# Patient Record
Sex: Female | Born: 1995 | Race: White | Hispanic: No | Marital: Married | Smoking: Never smoker
Health system: Southern US, Community
[De-identification: ages and names within clinical notes are randomized; demographics above are authoritative.]

## PROBLEM LIST (undated history)

## (undated) DIAGNOSIS — R112 Nausea with vomiting, unspecified: Secondary | ICD-10-CM

## (undated) DIAGNOSIS — D649 Anemia, unspecified: Secondary | ICD-10-CM

## (undated) DIAGNOSIS — F419 Anxiety disorder, unspecified: Secondary | ICD-10-CM

## (undated) HISTORY — DX: Other specified postprocedural states: R11.2

## (undated) HISTORY — DX: Anxiety disorder, unspecified: F41.9

## (undated) HISTORY — PX: TONSILLECTOMY: SUR1361

## (undated) HISTORY — DX: Nausea with vomiting, unspecified: R11.2

---
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
## 2013-10-07 ENCOUNTER — Ambulatory Visit (INDEPENDENT_AMBULATORY_CARE_PROVIDER_SITE_OTHER): Payer: BC Managed Care – PPO | Admitting: Family Medicine

## 2013-10-07 ENCOUNTER — Encounter: Payer: Self-pay | Admitting: Family Medicine

## 2013-10-07 VITALS — BP 116/72 | Temp 97.9°F | Ht 70.0 in | Wt 201.0 lb

## 2013-10-07 DIAGNOSIS — J329 Chronic sinusitis, unspecified: Secondary | ICD-10-CM

## 2013-10-07 MED ORDER — CEFPROZIL 500 MG PO TABS
500.0000 mg | ORAL_TABLET | Freq: Two times a day (BID) | ORAL | Status: DC
Start: 2013-10-07 — End: 2016-06-25

## 2013-10-07 NOTE — Progress Notes (Signed)
   Subjective:    Patient ID: Mckenzie RimaHolly E Reeves, female    DOB: January 18, 1996, 18 y.o.   MRN: [id number]  Sinusitis This is a new problem. Episode onset: Saturday am. There has been no fever. Associated symptoms include congestion, coughing, headaches, sinus pressure, sneezing and a sore throat. Past treatments include oral decongestants. The treatment provided no relief.   First stared with sore throat  Now tend in the glands  Face hurts eye hurts  Eyes and frontal headache  Blood and secretions plus green   Review of Systems  HENT: Positive for congestion, sinus pressure, sneezing and sore throat.   Respiratory: Positive for cough.   Neurological: Positive for headaches.       Objective:   Physical Exam  Alert moderate malaise. Good hydration. Vitals reviewed. HEENT moderate nasal congestion frontal tenderness. Trace normal neck supple lungs clear heart regular in rhythm      Assessment & Plan:   impression rhinosinusitis plan antibiotics prescribed. Symptomatic care discussed. Warning signs discussed. WSL

## 2016-06-25 ENCOUNTER — Encounter: Payer: Self-pay | Admitting: Family Medicine

## 2016-06-25 ENCOUNTER — Ambulatory Visit (INDEPENDENT_AMBULATORY_CARE_PROVIDER_SITE_OTHER): Payer: Managed Care, Other (non HMO) | Admitting: Family Medicine

## 2016-06-25 VITALS — BP 112/72 | Temp 98.3°F | Ht 70.0 in | Wt 208.0 lb

## 2016-06-25 DIAGNOSIS — J209 Acute bronchitis, unspecified: Secondary | ICD-10-CM

## 2016-06-25 DIAGNOSIS — J019 Acute sinusitis, unspecified: Secondary | ICD-10-CM

## 2016-06-25 DIAGNOSIS — J111 Influenza due to unidentified influenza virus with other respiratory manifestations: Secondary | ICD-10-CM | POA: Diagnosis not present

## 2016-06-25 MED ORDER — OSELTAMIVIR PHOSPHATE 75 MG PO CAPS: 75.0000 mg | ORAL_CAPSULE | Freq: Two times a day (BID) | ORAL | 0 refills | Status: DC

## 2016-06-25 MED ORDER — DOXYCYCLINE HYCLATE 100 MG PO CAPS: 100.0000 mg | ORAL_CAPSULE | Freq: Two times a day (BID) | ORAL | 0 refills | Status: DC

## 2016-06-25 NOTE — Patient Instructions (Signed)

## 2016-06-25 NOTE — Progress Notes (Signed)
   Subjective:    Patient ID: Prudence DavidsonHolly E Minervini, female    DOB: Nov 05, 1995, 21 y.o.   MRN: [id number]  URI   This is a new problem. The current episode started in the past 7 days. The problem has been unchanged. The maximum temperature recorded prior to her arrival was 102 - 102.9 F. Associated symptoms include congestion, coughing and rhinorrhea. Pertinent negatives include no chest pain, ear pain or wheezing. Associated symptoms comments: Body aches, runny nose, chills. Treatments tried: otc cold and flu gel caps. The treatment provided no relief.   Patient relates that she is felt a crackling in her throat when she coughs she denies being short of breath. She states she is had off and on body aches along with off and on low-grade fevers and feeling clammy from Friday night got worse on Saturday and Sunday relates moderate fatigue   Review of Systems  Constitutional: Negative for activity change and fever.  HENT: Positive for congestion and rhinorrhea. Negative for ear pain.   Eyes: Negative for discharge.  Respiratory: Positive for cough. Negative for shortness of breath and wheezing.   Cardiovascular: Negative for chest pain.       Objective:   Physical Exam Not rest or distress eardrums normal not toxic throat normal lungs clear heart regular no crackles no rails or rhonchi       Assessment & Plan:  Viral syndrome Probable influenza Secondary rhinosinusitis and bronchitis Tamiflu and antibiotics indicated Warning signs regarding the flu and warning signs regarding pneumonia discussed Lab work x-rays not indicated currently.

## 2016-06-28 ENCOUNTER — Ambulatory Visit (INDEPENDENT_AMBULATORY_CARE_PROVIDER_SITE_OTHER): Payer: Managed Care, Other (non HMO) | Admitting: Obstetrics and Gynecology

## 2016-06-28 ENCOUNTER — Encounter: Payer: Self-pay | Admitting: Obstetrics and Gynecology

## 2016-06-28 VITALS — BP 118/70 | HR 72 | Resp 14 | Ht 68.5 in | Wt 207.0 lb

## 2016-06-28 DIAGNOSIS — N913 Primary oligomenorrhea: Secondary | ICD-10-CM

## 2016-06-28 DIAGNOSIS — Z0000 Encounter for general adult medical examination without abnormal findings: Secondary | ICD-10-CM

## 2016-06-28 DIAGNOSIS — Z3009 Encounter for other general counseling and advice on contraception: Secondary | ICD-10-CM | POA: Diagnosis not present

## 2016-06-28 DIAGNOSIS — Z113 Encounter for screening for infections with a predominantly sexual mode of transmission: Secondary | ICD-10-CM | POA: Diagnosis not present

## 2016-06-28 DIAGNOSIS — Z01419 Encounter for gynecological examination (general) (routine) without abnormal findings: Secondary | ICD-10-CM

## 2016-06-28 DIAGNOSIS — Z124 Encounter for screening for malignant neoplasm of cervix: Secondary | ICD-10-CM

## 2016-06-28 DIAGNOSIS — N926 Irregular menstruation, unspecified: Secondary | ICD-10-CM | POA: Diagnosis not present

## 2016-06-28 DIAGNOSIS — E663 Overweight: Secondary | ICD-10-CM | POA: Diagnosis not present

## 2016-06-28 LAB — CBC
HEMATOCRIT: 41 % (ref 35.0–45.0)
Hemoglobin: 13.6 g/dL (ref 11.7–15.5)
MCH: 29.7 pg (ref 27.0–33.0)
MCHC: 33.2 g/dL (ref 32.0–36.0)
MCV: 89.5 fL (ref 80.0–100.0)
MPV: 9.9 fL (ref 7.5–12.5)
Platelets: 252 10*3/uL (ref 140–400)
RBC: 4.58 MIL/uL (ref 3.80–5.10)
RDW: 12.9 % (ref 11.0–15.0)
WBC: 4.8 10*3/uL (ref 3.8–10.8)

## 2016-06-28 LAB — POCT URINE PREGNANCY: Preg Test, Ur: NEGATIVE

## 2016-06-28 MED ORDER — MISOPROSTOL 200 MCG PO TABS: ORAL_TABLET | ORAL | 0 refills | Status: DC

## 2016-06-28 NOTE — Progress Notes (Signed)
21 y.o. G0P0000 MarriedCaucasianF here for annual exam.   She can go a year without a period or have one every month. It has been like this since she started her cycle. She has very bad back pain with her cycles. She gets nausea and dizzy with the pain in her back.  She stopped OCP's the end of November. No BTB.  Sexually active, married, with her husband x 5 years. Abstaining while off of OCP's. Cramps were bad even on the pill. She has a h/o bad acne, currently better with the medications she is on. No hirsutism. No weight changes.  Period Duration (Days): 3-13 days  Period Pattern: (!) Irregular Menstrual Flow: Heavy Menstrual Control: Maxi pad, Tampon Menstrual Control Change Freq (Hours): changes pad every 2 -3 hours on heavy days  Dysmenorrhea: (!) Severe Dysmenorrhea Symptoms: Cramping, Other (Comment), Nausea lower back pain  Patient's last menstrual period was 06/25/2016.          Sexually active: Yes.    The current method of family planning is none.    Exercising: Yes.    gym 3-4 times a week Smoker:  no  Health Maintenance: Pap:  2016 WNL per patient  History of abnormal Pap:  no TDaP:  unsure Gardasil: completed all 3    reports that she has never smoked. She has never used smokeless tobacco. She reports that she does not drink alcohol or use drugs.She graduated from [employer] last May. She works at an [employer]. Degree in Archeology.   Past Medical History:  Diagnosis Date  . Anxiety    Better since she is out of school.   Past Surgical History:  Procedure Laterality Date  . TONSILLECTOMY      No current outpatient prescriptions on file.   No current facility-administered medications for this visit.     Family History  Problem Relation Age of Onset  . Ovarian cysts Mother   . Ovarian cysts Sister   . Breast cancer Maternal Grandmother   . Diabetes Paternal Grandfather     Review of Systems  Constitutional: Negative.   HENT: Negative.   Eyes:  Negative.   Respiratory: Negative.   Cardiovascular: Negative.   Gastrointestinal: Negative.   Endocrine: Negative.   Genitourinary: Positive for menstrual problem.       Irregular menstrual cycles   Musculoskeletal: Negative.   Skin: Negative.   Allergic/Immunologic: Negative.   Neurological: Negative.   Psychiatric/Behavioral: Negative.     Exam:   BP 118/70 (BP Location: Right Arm, Patient Position: Sitting, Cuff Size: Normal)   Pulse 72   Resp 14   Ht 5' 8.5" (1.74 m)   Wt 207 lb (93.9 kg)   LMP 06/25/2016   BMI 31.02 kg/m   Weight change: @WEIGHTCHANGE @ Height:   Height: 5' 8.5" (174 cm)  Ht Readings from Last 3 Encounters:  06/28/16 5' 8.5" (1.74 m)  06/25/16 5\' 10"  (1.778 m)  10/07/13 5\' 10"  (1.778 m)    General appearance: alert, cooperative and appears stated age Head: Normocephalic, without obvious abnormality, atraumatic Neck: no adenopathy, supple, symmetrical, trachea midline and thyroid normal to inspection and palpation Lungs: clear to auscultation bilaterally Cardiovascular: regular rate and rhythm Breasts: normal appearance, no masses or tenderness Heart: regular rate and rhythm Abdomen: soft, non-tender; bowel sounds normal; no masses,  no organomegaly Extremities: extremities normal, atraumatic, no cyanosis or edema Skin: Skin color, texture, turgor normal. No rashes or lesions Lymph nodes: Cervical, supraclavicular, and axillary nodes normal. No abnormal inguinal  nodes palpated Neurologic: Grossly normal   Pelvic: External genitalia:  no lesions              Urethra:  normal appearing urethra with no masses, tenderness or lesions              Bartholins and Skenes: normal                 Vagina: normal appearing vagina with normal color and discharge, no lesions              Cervix: no lesions               Bimanual Exam:  Uterus:  normal size, contour, position, consistency, mobility, non-tender and retroverted              Adnexa: no mass,  fullness, tenderness               Rectovaginal: Confirms               Anus:  normal sphincter tone, no lesions  Chaperone was present for exam.  A:  Well Woman with normal exam  Long h/o oligomenorrhea  Acne, no hirsutism  H/O dysmenorrhea  Suspect PCOS  Contraception, discussed options  P:   UPT  TSH, prolactin  Pap   genprobe  HgbA1C, lipids, CMP, vit D, CBC  Discussed restarting OCP's or trying a mirena IUD  Reviewed the risks with mirena IUD insertion and irregular bleeding after insertion  She hasn't been sexually active for months, currently on her cycle (but cycles are irregular)  Discussed returning next week for IUD insertion (she will abstain from intercourse)  Would pre-treat with cytotec and ibuprofen   Addendum: on further discussion she would like to return for the mirena IUD.

## 2016-06-28 NOTE — Patient Instructions (Signed)
EXERCISE AND DIET:  We recommended that you start or continue a regular exercise program for good health. Regular exercise means any activity that makes your heart beat faster and makes you sweat.  We recommend exercising at least 30 minutes per day at least 3 days a week, preferably 4 or 5.  We also recommend a diet low in fat and sugar.  Inactivity, poor dietary choices and obesity can cause diabetes, heart attack, stroke, and kidney damage, among others.    ALCOHOL AND SMOKING:  Women should limit their alcohol intake to no more than 7 drinks/beers/glasses of wine (combined, not each!) per week. Moderation of alcohol intake to this level decreases your risk of breast cancer and liver damage. And of course, no recreational drugs are part of a healthy lifestyle.  And absolutely no smoking or even second hand smoke. Most people know smoking can cause heart and lung diseases, but did you know it also contributes to weakening of your bones? Aging of your skin?  Yellowing of your teeth and nails?  CALCIUM AND VITAMIN D:  Adequate intake of calcium and Vitamin D are recommended.  The recommendations for exact amounts of these supplements seem to change often, but generally speaking 600 mg of calcium (either carbonate or citrate) and 800 units of Vitamin D per day seems prudent. Certain women may benefit from higher intake of Vitamin D.  If you are among these women, your doctor will have told you during your visit.    PAP SMEARS:  Pap smears, to check for cervical cancer or precancers,  have traditionally been done yearly, although recent scientific advances have shown that most women can have pap smears less often.  However, every woman still should have a physical exam from her gynecologist every year. It will include a breast check, inspection of the vulva and vagina to check for abnormal growths or skin changes, a visual exam of the cervix, and then an exam to evaluate the size and shape of the uterus and  ovaries.  And after 23 years of age, a rectal exam is indicated to check for rectal cancers. We will also provide age appropriate advice regarding health maintenance, like when you should have certain vaccines, screening for sexually transmitted diseases, bone density testing, colonoscopy, mammograms, etc.    Polycystic Ovarian Syndrome Polycystic ovarian syndrome (PCOS) is a common hormonal disorder among women of reproductive age. In most women with PCOS, many small fluid-filled sacs (cysts) grow on the ovaries, and the cysts are not part of a normal menstrual cycle. PCOS can cause problems with your menstrual periods and make it difficult to get pregnant. It can also cause an increased risk of miscarriage with pregnancy. If it is not treated, PCOS can lead to serious health problems, such as diabetes and heart disease. What are the causes? The cause of PCOS is not known, but it may be the result of a combination of certain factors, such as:  Irregular menstrual cycle.  High levels of certain hormones (androgens).  Problems with the hormone that helps to control blood sugar (insulin resistance).  Certain genes. What increases the risk? This condition is more likely to develop in women who have a family history of PCOS. What are the signs or symptoms? Symptoms of PCOS may include:  Multiple ovarian cysts.  Infrequent periods or no periods.  Periods that are too frequent or too heavy.  Unpredictable periods.  Inability to get pregnant (infertility) because of not ovulating.  Increased growth of  hair on the face, chest, stomach, back, thumbs, thighs, or toes.  Acne or oily skin. Acne may develop during adulthood, and it may not respond to treatment.  Pelvic pain.  Weight gain or obesity.  Patches of thickened and dark brown or black skin on the neck, arms, breasts, or thighs (acanthosis nigricans).  Excess hair growth on the face, chest, abdomen, or upper thighs  (hirsutism). How is this diagnosed? This condition is diagnosed based on:  Your medical history.  A physical exam, including a pelvic exam. Your health care provider may look for areas of increased hair growth on your skin.  Tests, such as:  Ultrasound. This may be used to examine the ovaries and the lining of the uterus (endometrium) for cysts.  Blood tests. These may be used to check levels of sugar (glucose), female hormone (testosterone), and female hormones (estrogen and progesterone) in your blood. How is this treated? There is no cure for PCOS, but treatment can help to manage symptoms and prevent more health problems from developing. Treatment varies depending on:  Your symptoms.  Whether you want to have a baby or whether you need birth control (contraception). Treatment may include nutrition and lifestyle changes along with:  Progesterone hormone to start a menstrual period.  Birth control pills to help you have regular menstrual periods.  Medicines to make you ovulate, if you want to get pregnant.  Medicine to reduce excessive hair growth.  Surgery, in severe cases. This may involve making small holes in one or both of your ovaries. This decreases the amount of testosterone that your body produces. Follow these instructions at home:  Take over-the-counter and prescription medicines only as told by your health care provider.  Follow a healthy meal plan. This can help you reduce the effects of PCOS.  Eat a healthy diet that includes lean proteins, complex carbohydrates, fresh fruits and vegetables, low-fat dairy products, and healthy fats. Make sure to eat enough fiber.  If you are overweight, lose weight as told by your health care provider.  Losing 10% of your body weight may improve symptoms.  Your health care provider can determine how much weight loss is best for you and can help you lose weight safely.  Keep all follow-up visits as told by your health care  provider. This is important. Contact a health care provider if:  Your symptoms do not get better with medicine.  You develop new symptoms. This information is not intended to replace advice given to you by your health care provider. Make sure you discuss any questions you have with your health care provider. Document Released: 08/31/2004 Document Revised: 01/03/2016 Document Reviewed: 10/23/2015 Elsevier Interactive Patient Education  2017 [employer]

## 2016-06-29 LAB — COMPREHENSIVE METABOLIC PANEL
ALT: 17 U/L (ref 6–29)
AST: 16 U/L (ref 10–30)
Albumin: 4.6 g/dL (ref 3.6–5.1)
Alkaline Phosphatase: 57 U/L (ref 33–115)
BUN: 12 mg/dL (ref 7–25)
CALCIUM: 9.1 mg/dL (ref 8.6–10.2)
CHLORIDE: 105 mmol/L (ref 98–110)
CO2: 28 mmol/L (ref 20–31)
Creat: 0.84 mg/dL (ref 0.50–1.10)
GLUCOSE: 85 mg/dL (ref 65–99)
POTASSIUM: 4 mmol/L (ref 3.5–5.3)
Sodium: 143 mmol/L (ref 135–146)
Total Bilirubin: 0.4 mg/dL (ref 0.2–1.2)
Total Protein: 7.3 g/dL (ref 6.1–8.1)

## 2016-06-29 LAB — LIPID PANEL
CHOL/HDL RATIO: 3.8 ratio (ref <5.0)
CHOLESTEROL: 134 mg/dL (ref <200)
HDL: 35 mg/dL — AB (ref >50)
LDL Cholesterol: 70 mg/dL (ref <100)
TRIGLYCERIDES: 147 mg/dL (ref <150)
VLDL: 29 mg/dL (ref <30)

## 2016-06-29 LAB — PROLACTIN: Prolactin: 7.8 ng/mL

## 2016-06-29 LAB — HEMOGLOBIN A1C
Hgb A1c MFr Bld: 5.2 % (ref <5.7)
MEAN PLASMA GLUCOSE: 103 mg/dL

## 2016-06-29 LAB — TSH: TSH: 2.06 mIU/L

## 2016-06-29 LAB — VITAMIN D 25 HYDROXY (VIT D DEFICIENCY, FRACTURES): Vit D, 25-Hydroxy: 21 ng/mL — ABNORMAL LOW (ref 30–100)

## 2016-07-02 ENCOUNTER — Encounter: Payer: Self-pay | Admitting: Obstetrics and Gynecology

## 2016-07-02 ENCOUNTER — Ambulatory Visit (INDEPENDENT_AMBULATORY_CARE_PROVIDER_SITE_OTHER): Payer: Managed Care, Other (non HMO) | Admitting: Obstetrics and Gynecology

## 2016-07-02 VITALS — BP 110/70 | HR 84 | Resp 14 | Wt 211.0 lb

## 2016-07-02 DIAGNOSIS — Z3009 Encounter for other general counseling and advice on contraception: Secondary | ICD-10-CM

## 2016-07-02 DIAGNOSIS — Z30430 Encounter for insertion of intrauterine contraceptive device: Secondary | ICD-10-CM | POA: Diagnosis not present

## 2016-07-02 DIAGNOSIS — Z01812 Encounter for preprocedural laboratory examination: Secondary | ICD-10-CM

## 2016-07-02 LAB — IPS N GONORRHOEA AND CHLAMYDIA BY PCR

## 2016-07-02 LAB — POCT URINE PREGNANCY: PREG TEST UR: NEGATIVE

## 2016-07-02 LAB — IPS PAP SMEAR ONLY

## 2016-07-02 NOTE — Progress Notes (Signed)
GYNECOLOGY  VISIT   HPI: 21 y.o.   Married  Caucasian  female   G0P0000 with Patient's last menstrual period was 06/25/2016.   here for Mirena IUD insertion for contraception, dysmenorrhea and oligomenorrhea.  Genprobe pending from last week. Not sexually active since her LMP.   GYNECOLOGIC HISTORY: Patient's last menstrual period was 06/25/2016. Contraception:none  Menopausal hormone therapy: none         OB History    Gravida Para Term Preterm AB Living   0 0 0 0 0 0   SAB TAB Ectopic Multiple Live Births   0 0 0 0 0         There are no active problems to display for this patient.   Past Medical History:  Diagnosis Date  . Anxiety     Past Surgical History:  Procedure Laterality Date  . TONSILLECTOMY      Current Outpatient Prescriptions  Medication Sig Dispense Refill  . misoprostol (CYTOTEC) 200 MCG tablet 2 tablets intravaginally 6-12 hours prior to IUD insertion 2 tablet 0  . doxycycline (VIBRAMYCIN) 100 MG capsule TAKE 1 CAPSULE (100 MG TOTAL) BY MOUTH 2 (TWO) TIMES DAILY.  0   No current facility-administered medications for this visit.      ALLERGIES: Ciprofloxacin  Family History  Problem Relation Age of Onset  . Ovarian cysts Mother   . Ovarian cysts Sister   . Breast cancer Maternal Grandmother   . Diabetes Paternal Grandfather     Social History   Social History  . Marital status: Married    Spouse name: N/A  . Number of children: N/A  . Years of education: N/A   Occupational History  . Not on file.   Social History Main Topics  . Smoking status: Never Smoker  . Smokeless tobacco: Never Used  . Alcohol use No  . Drug use: No  . Sexual activity: Yes    Partners: Male    Birth control/ protection: Pill   Other Topics Concern  . Not on file   Social History Narrative  . No narrative on file    Review of Systems  Constitutional: Negative.   HENT: Negative.   Eyes: Negative.   Respiratory: Negative.   Cardiovascular:  Negative.   Gastrointestinal: Negative.   Genitourinary: Negative.   Musculoskeletal: Negative.   Skin: Negative.   Neurological: Negative.   Endo/Heme/Allergies: Negative.   Psychiatric/Behavioral: Negative.     PHYSICAL EXAMINATION:    BP 110/70 (BP Location: Right Arm, Patient Position: Sitting, Cuff Size: Normal)   Pulse 84   Resp 14   Wt 211 lb (95.7 kg)   LMP 06/25/2016   BMI 31.62 kg/m     General appearance: alert, cooperative and appears stated age  Pelvic: External genitalia:  no lesions              Urethra:  normal appearing urethra with no masses, tenderness or lesions              Bartholins and Skenes: normal                 Vagina: normal appearing vagina with normal color and discharge, no lesions              Cervix: no lesions              Bimanual Exam:  Uterus:  normal size, contour, position, consistency, mobility, non-tender and retroverted  Adnexa: no mass, fullness, tenderness                The risks of the mirena IUD were reviewed with the patient, including infection, abnormal bleeding and uterine perfortion. Consent was signed.  A speculum was placed in the vagina, the cervix was cleansed with betadine. A tenaculum was placed on the cervix, the uterus sounded to 8 cm. The cervix was dilated to a 5 hagar dilator  The mirena IUD was inserted without difficulty. The string were cut to 3-4 cm. The tenaculum was removed. Slight oozing from the tenaculum site was stopped with pressure.   The patient tolerated the procedure well.   Chaperone was present for exam.  ASSESSMENT Contraception Oligomenorrhea Dysmenorrhea    PLAN Mirena IUD inserted F/U in 1 month Call with any concerns   An After Visit Summary was printed and given to the patient.

## 2016-07-02 NOTE — Patient Instructions (Signed)

## 2016-07-26 ENCOUNTER — Telehealth: Payer: Self-pay | Admitting: Obstetrics and Gynecology

## 2016-07-26 NOTE — Telephone Encounter (Signed)
Left message to call and reschedule cancelled appointment.

## 2016-07-31 ENCOUNTER — Ambulatory Visit (INDEPENDENT_AMBULATORY_CARE_PROVIDER_SITE_OTHER): Payer: Managed Care, Other (non HMO) | Admitting: Obstetrics and Gynecology

## 2016-07-31 ENCOUNTER — Encounter: Payer: Self-pay | Admitting: Obstetrics and Gynecology

## 2016-07-31 VITALS — BP 110/60 | HR 68 | Resp 16 | Ht 69.0 in | Wt 210.0 lb

## 2016-07-31 DIAGNOSIS — Z30431 Encounter for routine checking of intrauterine contraceptive device: Secondary | ICD-10-CM | POA: Diagnosis not present

## 2016-07-31 NOTE — Progress Notes (Signed)
GYNECOLOGY  VISIT   HPI: 21 y.o.   Married  Caucasian  female   G0P0000 with No LMP recorded. Patient is not currently having periods (Reason: IUD).   here for IUD check. She had a mirena inserted last month. She has a h/o oligomenorrhea and dysmenorrhea, she also needed contraception.    She c/o intermittent pain in either lower quadrant of her abdomen. The pain is sharp or dull, they last for up to 20 minutes, tolerable. Has been occurring about 3 days a week. Random. Sexually active, no pain. She hasn't bleed since insertion.   GYNECOLOGIC HISTORY: No LMP recorded. Patient is not currently having periods (Reason: IUD). Contraception: Mirena IUD Menopausal hormone therapy: n/a        OB History    Gravida Para Term Preterm AB Living   0 0 0 0 0 0   SAB TAB Ectopic Multiple Live Births   0 0 0 0 0         There are no active problems to display for this patient.   Past Medical History:  Diagnosis Date  . Anxiety     Past Surgical History:  Procedure Laterality Date  . TONSILLECTOMY      Current Outpatient Prescriptions  Medication Sig Dispense Refill  . levonorgestrel (MIRENA, 52 MG,) 20 MCG/24HR IUD 1 each by Intrauterine route once.     No current facility-administered medications for this visit.      ALLERGIES: Ciprofloxacin  Family History  Problem Relation Age of Onset  . Ovarian cysts Mother   . Ovarian cysts Sister   . Breast cancer Maternal Grandmother   . Diabetes Paternal Grandfather     Social History   Social History  . Marital status: Married    Spouse name: N/A  . Number of children: N/A  . Years of education: N/A   Occupational History  . Not on file.   Social History Main Topics  . Smoking status: Never Smoker  . Smokeless tobacco: Never Used  . Alcohol use No  . Drug use: No  . Sexual activity: Yes    Partners: Male    Birth control/ protection: Pill   Other Topics Concern  . Not on file   Social History Narrative  . No  narrative on file    Review of Systems  Constitutional: Negative.   HENT: Negative.   Eyes: Negative.   Respiratory: Negative.   Cardiovascular: Negative.   Gastrointestinal: Negative.   Genitourinary: Negative.   Musculoskeletal: Negative.   Skin: Negative.   Neurological: Negative.   Endo/Heme/Allergies: Negative.   Psychiatric/Behavioral: Negative.     PHYSICAL EXAMINATION:    BP 110/60 (BP Location: Right Arm, Patient Position: Sitting, Cuff Size: Normal)   Pulse 68   Resp 16   Ht 5\' 9"  (1.753 m)   Wt 210 lb (95.3 kg)   BMI 31.01 kg/m     General appearance: alert, cooperative and appears stated age  Pelvic: External genitalia:  no lesions              Urethra:  normal appearing urethra with no masses, tenderness or lesions              Bartholins and Skenes: normal                 Vagina: normal appearing vagina with normal color and discharge, no lesions              Cervix: no lesions and IUD  string 2 cm              Bimanual Exam:  Uterus:  normal size, contour, position, consistency, mobility, non-tender              Adnexa: no mass, fullness, tenderness               Chaperone was present for exam.  ASSESSMENT IUD check up. Overall doing well, normal exam She c/o intermittent pelvic pain, if it persists she will call to set up a gyn ultrasound    PLAN Call with persistent pain F/U annual exam in 2/19   An After Visit Summary was printed and given to the patient.

## 2016-08-01 ENCOUNTER — Ambulatory Visit: Payer: Managed Care, Other (non HMO) | Admitting: Obstetrics and Gynecology

## 2016-11-23 ENCOUNTER — Other Ambulatory Visit: Payer: Self-pay | Admitting: Obstetrics and Gynecology

## 2016-11-23 NOTE — Telephone Encounter (Signed)
I will have Dr. Oscar LaJertson review this prescription.   Cc- Dr. Oscar LaJertson

## 2016-11-23 NOTE — Telephone Encounter (Signed)
Medication refill request: clindamycin-benzoyl peroxide Last AEX:  06-28-16 Next AEX: 07-01-17 Last MMG (if hormonal medication request): n/a Refill authorized: I called patient but there was no answer.I don't see where we ever gave this to the patient please approve or deny.

## 2016-11-26 ENCOUNTER — Telehealth: Payer: Self-pay | Admitting: *Deleted

## 2016-11-26 NOTE — Telephone Encounter (Signed)
She needs to get that medication from her primary or dermatology

## 2016-11-26 NOTE — Telephone Encounter (Signed)
Request faxed back with message below.

## 2016-11-26 NOTE — Telephone Encounter (Signed)
Medication refill request: Tretinoin 0.05% cream  Last AEX:  06/28/16 JJ Next AEX: 07/01/17 JJ  Last MMG (if hormonal medication request): none Refill authorized:   previously written by another provider. Patient request request sent to JJ  Please advise.

## 2016-12-03 ENCOUNTER — Telehealth: Payer: Self-pay | Admitting: Obstetrics and Gynecology

## 2016-12-03 NOTE — Telephone Encounter (Signed)
Patient is asking for the status of her refill request of Tretinoin 0.05% cream. Patient said "I have not received a call from the office or my pharmacy and it has been a week.'

## 2016-12-03 NOTE — Telephone Encounter (Signed)
I spoke with the patient and let her know the prescription request was denied and she will need to have this medication prescribed by her primary or dermatology. I apologized to the patient that she was not made aware of that decision. Patient voices understanding and will look into getting prescription from dermatology.

## 2017-07-01 ENCOUNTER — Other Ambulatory Visit: Payer: Self-pay

## 2017-07-01 ENCOUNTER — Ambulatory Visit: Payer: BLUE CROSS/BLUE SHIELD | Admitting: Obstetrics and Gynecology

## 2017-07-01 ENCOUNTER — Encounter: Payer: Self-pay | Admitting: Obstetrics and Gynecology

## 2017-07-01 VITALS — BP 128/80 | HR 72 | Resp 16 | Ht 68.25 in | Wt 208.0 lb

## 2017-07-01 DIAGNOSIS — Z6831 Body mass index (BMI) 31.0-31.9, adult: Secondary | ICD-10-CM

## 2017-07-01 DIAGNOSIS — Z0000 Encounter for general adult medical examination without abnormal findings: Secondary | ICD-10-CM

## 2017-07-01 DIAGNOSIS — E786 Lipoprotein deficiency: Secondary | ICD-10-CM

## 2017-07-01 DIAGNOSIS — Z01419 Encounter for gynecological examination (general) (routine) without abnormal findings: Secondary | ICD-10-CM | POA: Diagnosis not present

## 2017-07-01 DIAGNOSIS — E559 Vitamin D deficiency, unspecified: Secondary | ICD-10-CM | POA: Diagnosis not present

## 2017-07-01 DIAGNOSIS — Z3169 Encounter for other general counseling and advice on procreation: Secondary | ICD-10-CM

## 2017-07-01 DIAGNOSIS — Z30431 Encounter for routine checking of intrauterine contraceptive device: Secondary | ICD-10-CM | POA: Diagnosis not present

## 2017-07-01 DIAGNOSIS — L659 Nonscarring hair loss, unspecified: Secondary | ICD-10-CM | POA: Diagnosis not present

## 2017-07-01 NOTE — Progress Notes (Signed)
22 y.o. G0P0000 MarriedCaucasianF here for annual exam.  She has a mirena IUD, placed in 2/18. No cycles. No dyspareunia.  She is considering getting pregnant at the end of the year. H/O oligomenorrhea.     No LMP recorded. Patient is not currently having periods (Reason: IUD).          Sexually active: Yes.    The current method of family planning is IUD (Mirena).    Exercising: Yes.    gym 3 days a week cardio/ weights Smoker:  no  Health Maintenance: Pap:  06-28-16 WNL History of abnormal Pap:  no TDaP:  Unsure, she will check with her mom Gardasil: completed 3    reports that  has never smoked. she has never used smokeless tobacco. She reports that she drinks alcohol. She reports that she does not use drugs. Just occasional ETOH. She works at an [employer]. Degree in Archeology.  Past Medical History:  Diagnosis Date  . Anxiety     Past Surgical History:  Procedure Laterality Date  . TONSILLECTOMY      Current Outpatient Medications  Medication Sig Dispense Refill  . levonorgestrel (MIRENA, 52 MG,) 20 MCG/24HR IUD 1 each by Intrauterine route once.     No current facility-administered medications for this visit.   Taking over the counter vit D, not sure of the dose.   Family History  Problem Relation Age of Onset  . Ovarian cysts Mother   . Ovarian cysts Sister   . Breast cancer Maternal Grandmother   . Diabetes Paternal Grandfather     Review of Systems  Constitutional: Negative.   HENT: Negative.   Eyes: Negative.   Respiratory: Negative.   Cardiovascular: Negative.   Gastrointestinal: Negative.   Endocrine: Negative.   Genitourinary: Negative.   Musculoskeletal: Negative.   Skin: Negative.   Allergic/Immunologic: Negative.   Neurological: Negative.   Psychiatric/Behavioral: Negative.   H/O hair thinning for the last 4 months  Exam:   BP 128/80 (BP Location: Right Arm, Patient Position: Sitting, Cuff Size: Normal)   Pulse 72   Resp 16   Ht 5'  8.25" (1.734 m)   Wt 208 lb (94.3 kg)   BMI 31.40 kg/m   Weight change: @WEIGHTCHANGE @ Height:   Height: 5' 8.25" (173.4 cm)  Ht Readings from Last 3 Encounters:  07/01/17 5' 8.25" (1.734 m)  07/31/16 5\' 9"  (1.753 m)  06/28/16 5' 8.5" (1.74 m)    General appearance: alert, cooperative and appears stated age Head: Normocephalic, without obvious abnormality, atraumatic Neck: no adenopathy, supple, symmetrical, trachea midline and thyroid normal to inspection and palpation Lungs: clear to auscultation bilaterally Cardiovascular: regular rate and rhythm Breasts: normal appearance, no masses or tenderness Abdomen: soft, non-tender; non distended,  no masses,  no organomegaly Extremities: extremities normal, atraumatic, no cyanosis or edema Skin: Skin color, texture, turgor normal. No rashes or lesions Lymph nodes: Cervical, supraclavicular, and axillary nodes normal. No abnormal inguinal nodes palpated Neurologic: Grossly normal   Pelvic: External genitalia:  no lesions              Urethra:  normal appearing urethra with no masses, tenderness or lesions              Bartholins and Skenes: normal                 Vagina: normal appearing vagina with normal color and discharge, no lesions  Cervix: no lesions, IUD string 2 cm               Bimanual Exam:  Uterus:  normal size, contour, position, consistency, mobility, non-tender              Adnexa: no mass, fullness, tenderness               Rectovaginal: Confirms               Anus:  normal sphincter tone, no lesions  Chaperone was present for exam.  A:  Well Woman with normal exam  IUD check  Vit d def  Low HDL  Hair is thinning   Considering conception, h/o oligomenorrhea  P:   No pap this year  Vit D  TSH  CBC, CMP, Lipid panel  Discussed breast self exam  Discussed calcium and vit D intake  Preconception information given  Start PNV when she is thinking about taking her IUD out  May need  clomid  Rubella  Declines STD testing

## 2017-07-01 NOTE — Patient Instructions (Addendum)
EXERCISE AND DIET:  We recommended that you start or continue a regular exercise program for good health. Regular exercise means any activity that makes your heart beat faster and makes you sweat.  We recommend exercising at least 30 minutes per day at least 3 days a week, preferably 4 or 5.  We also recommend a diet low in fat and sugar.  Inactivity, poor dietary choices and obesity can cause diabetes, heart attack, stroke, and kidney damage, among others.    ALCOHOL AND SMOKING:  Women should limit their alcohol intake to no more than 7 drinks/beers/glasses of wine (combined, not each!) per week. Moderation of alcohol intake to this level decreases your risk of breast cancer and liver damage. And of course, no recreational drugs are part of a healthy lifestyle.  And absolutely no smoking or even second hand smoke. Most people know smoking can cause heart and lung diseases, but did you know it also contributes to weakening of your bones? Aging of your skin?  Yellowing of your teeth and nails?  CALCIUM AND VITAMIN D:  Adequate intake of calcium and Vitamin D are recommended.  The recommendations for exact amounts of these supplements seem to change often, but generally speaking 600 mg of calcium (either carbonate or citrate) and 800 units of Vitamin D per day seems prudent. Certain women may benefit from higher intake of Vitamin D.  If you are among these women, your doctor will have told you during your visit.    PAP SMEARS:  Pap smears, to check for cervical cancer or precancers,  have traditionally been done yearly, although recent scientific advances have shown that most women can have pap smears less often.  However, every woman still should have a physical exam from her gynecologist every year. It will include a breast check, inspection of the vulva and vagina to check for abnormal growths or skin changes, a visual exam of the cervix, and then an exam to evaluate the size and shape of the uterus and  ovaries.  And after 24 years of age, a rectal exam is indicated to check for rectal cancers. We will also provide age appropriate advice regarding health maintenance, like when you should have certain vaccines, screening for sexually transmitted diseases, bone density testing, colonoscopy, mammograms, etc.    Preparing for Pregnancy If you are considering becoming pregnant, make an appointment to see your regular health care provider to learn how to prepare for a safe and healthy pregnancy (preconception care). During a preconception care visit, your health care provider will:  Do a complete physical exam, including a Pap test.  Take a complete medical history.  Give you information, answer your questions, and help you resolve problems.  Preconception checklist Medical history  Tell your health care provider about any current or past medical conditions. Your pregnancy or your ability to become pregnant may be affected by chronic conditions, such as diabetes, chronic hypertension, and thyroid problems.  Include your family's medical history as well as your partner's medical history.  Tell your health care provider about any history of STIs (sexually transmitted infections).These can affect your pregnancy. In some cases, they can be passed to your baby. Discuss any concerns that you have about STIs.  If indicated, discuss the benefits of genetic testing. This testing will show whether there are any genetic conditions that may be passed from you or your partner to your baby.  Tell your health care provider about: ? Any problems you have had with conception  or pregnancy. ? Any medicines you take. These include vitamins, herbal supplements, and over-the-counter medicines. ? Your history of immunizations. Discuss any vaccinations that you may need.  Diet  Ask your health care provider what to include in a healthy diet that has a balance of nutrients. This is especially important when you are  pregnant or preparing to become pregnant.  Ask your health care provider to help you reach a healthy weight before pregnancy. ? If you are overweight, you may be at higher risk for certain complications, such as high blood pressure, diabetes, and preterm birth. ? If you are underweight, you are more likely to have a baby who has a low birth weight.  Lifestyle, work, and home  Let your health care provider know: ? About any lifestyle habits that you have, such as alcohol use, drug use, or smoking. ? About recreational activities that may put you at risk during pregnancy, such as downhill skiing and certain exercise programs. ? Tell your health care provider about any international travel, especially any travel to places with an active [place] virus outbreak. ? About harmful substances that you may be exposed to at work or at home. These include chemicals, pesticides, radiation, or even litter boxes. ? If you do not feel safe at home.  Mental health  Tell your health care provider about: ? Any history of mental health conditions, including feelings of depression, sadness, or anxiety. ? Any medicines that you take for a mental health condition. These include herbs and supplements.  Home instructions to prepare for pregnancy Lifestyle  Eat a balanced diet. This includes fresh fruits and vegetables, whole grains, lean meats, low-fat dairy products, healthy fats, and foods that are high in fiber. Ask to meet with a nutritionist or registered dietitian for assistance with meal planning and goals.  Get regular exercise. Try to be active for at least 30 minutes a day on most days of the week. Ask your health care provider which activities are safe during pregnancy.  Do not use any products that contain nicotine or tobacco, such as cigarettes and e-cigarettes. If you need help quitting, ask your health care provider.  Do not drink alcohol.  Do not take illegal drugs.  Maintain a healthy weight.  Ask your health care provider what weight range is right for you.  General instructions  Keep an accurate record of your menstrual periods. This makes it easier for your health care provider to determine your baby's due date.  Begin taking prenatal vitamins and folic acid supplements daily as directed by your health care provider.  Manage any chronic conditions, such as high blood pressure and diabetes, as told by your health care provider. This is important.  How do I know that I am pregnant? You may be pregnant if you have been sexually active and you miss your period. Symptoms of early pregnancy include:  Mild cramping.  Very light vaginal bleeding (spotting).  Feeling unusually tired.  Nausea and vomiting (morning sickness).  If you have any of these symptoms and you suspect that you might be pregnant, you can take a home pregnancy test. These tests check for a hormone in your urine (human chorionic gonadotropin, or hCG). A woman's body begins to make this hormone during early pregnancy. These tests are very accurate. Wait until at least the first day after you miss your period to take one. If the test shows that you are pregnant (you get a positive result), call your health care provider to  make an appointment for prenatal care. What should I do if I become pregnant?  Make an appointment with your health care provider as soon as you suspect you are pregnant.  Do not use any products that contain nicotine, such as cigarettes, chewing tobacco, and e-cigarettes. If you need help quitting, ask your health care provider.  Do not drink alcoholic beverages. Alcohol is related to a number of birth defects.  Avoid toxic odors and chemicals.  You may continue to have sexual intercourse if it does not cause pain or other problems, such as vaginal bleeding. This information is not intended to replace advice given to you by your health care provider. Make sure you discuss any questions you  have with your health care provider. Document Released: 04/19/2008 Document Revised: 01/03/2016 Document Reviewed: 11/27/2015 Elsevier Interactive Patient Education  [employer]2018 [employer] Inc.

## 2017-07-02 ENCOUNTER — Telehealth: Payer: Self-pay | Admitting: *Deleted

## 2017-07-02 LAB — LIPID PANEL
CHOL/HDL RATIO: 3 ratio (ref 0.0–4.4)
Cholesterol, Total: 151 mg/dL (ref 100–199)
HDL: 51 mg/dL (ref >39)
LDL Calculated: 83 mg/dL (ref 0–99)
Triglycerides: 85 mg/dL (ref 0–149)
VLDL CHOLESTEROL CAL: 17 mg/dL (ref 5–40)

## 2017-07-02 LAB — COMPREHENSIVE METABOLIC PANEL
ALBUMIN: 4.8 g/dL (ref 3.5–5.5)
ALT: 16 IU/L (ref 0–32)
AST: 18 IU/L (ref 0–40)
Albumin/Globulin Ratio: 1.8 (ref 1.2–2.2)
Alkaline Phosphatase: 95 IU/L (ref 39–117)
BUN / CREAT RATIO: 14 (ref 9–23)
BUN: 11 mg/dL (ref 6–20)
Bilirubin Total: 0.3 mg/dL (ref 0.0–1.2)
CALCIUM: 9.5 mg/dL (ref 8.7–10.2)
CO2: 23 mmol/L (ref 20–29)
Chloride: 100 mmol/L (ref 96–106)
Creatinine, Ser: 0.79 mg/dL (ref 0.57–1.00)
GFR, EST AFRICAN AMERICAN: 122 mL/min/{1.73_m2} (ref >59)
GFR, EST NON AFRICAN AMERICAN: 106 mL/min/{1.73_m2} (ref >59)
Globulin, Total: 2.6 g/dL (ref 1.5–4.5)
Glucose: 83 mg/dL (ref 65–99)
Potassium: 4.2 mmol/L (ref 3.5–5.2)
Sodium: 140 mmol/L (ref 134–144)
TOTAL PROTEIN: 7.4 g/dL (ref 6.0–8.5)

## 2017-07-02 LAB — CBC
HEMOGLOBIN: 13.1 g/dL (ref 11.1–15.9)
Hematocrit: 39.6 % (ref 34.0–46.6)
MCH: 30.2 pg (ref 26.6–33.0)
MCHC: 33.1 g/dL (ref 31.5–35.7)
MCV: 91 fL (ref 79–97)
PLATELETS: 259 10*3/uL (ref 150–379)
RBC: 4.34 x10E6/uL (ref 3.77–5.28)
RDW: 12.8 % (ref 12.3–15.4)
WBC: 7.8 10*3/uL (ref 3.4–10.8)

## 2017-07-02 LAB — RUBELLA SCREEN: Rubella Antibodies, IGG: 17.1 index (ref >0.99)

## 2017-07-02 LAB — HEMOGLOBIN A1C
Est. average glucose Bld gHb Est-mCnc: 105 mg/dL
Hgb A1c MFr Bld: 5.3 % (ref 4.8–5.6)

## 2017-07-02 LAB — TSH: TSH: 3.23 u[IU]/mL (ref 0.450–4.500)

## 2017-07-02 LAB — VITAMIN D 25 HYDROXY (VIT D DEFICIENCY, FRACTURES): VIT D 25 HYDROXY: 16.2 ng/mL — AB (ref 30.0–100.0)

## 2017-07-02 NOTE — Telephone Encounter (Signed)
Spoke with patient and gave results and recommendations. Scheduled for 12 week recheck Vita D -eh

## 2017-07-02 NOTE — Telephone Encounter (Signed)
Left message to call regarding lab results -eh 

## 2017-07-02 NOTE — Telephone Encounter (Signed)
-----   Message from Romualdo BolkJill Evelyn Jertson, MD sent at 07/02/2017  2:59 PM EST ----- Please inform the patient that her vit d level is very low, I would recommend that she go on 2,000 IU of vit d 3 daily and we should recheck her level in 3 months.  The rest of her lab work was okay, she is immune to [place].

## 2017-07-02 NOTE — Telephone Encounter (Signed)
Patient is returning a call to Elaine. °

## 2017-09-24 ENCOUNTER — Other Ambulatory Visit: Payer: BLUE CROSS/BLUE SHIELD

## 2017-09-24 DIAGNOSIS — E559 Vitamin D deficiency, unspecified: Secondary | ICD-10-CM

## 2017-09-24 NOTE — Progress Notes (Unsigned)
Vid

## 2017-09-25 ENCOUNTER — Telehealth: Payer: Self-pay | Admitting: *Deleted

## 2017-09-25 LAB — VITAMIN D 25 HYDROXY (VIT D DEFICIENCY, FRACTURES): VIT D 25 HYDROXY: 15.7 ng/mL — AB (ref 30.0–100.0)

## 2017-09-25 MED ORDER — VITAMIN D (ERGOCALCIFEROL) 1.25 MG (50000 UNIT) PO CAPS: 50000.0000 [IU] | ORAL_CAPSULE | ORAL | 0 refills | Status: DC

## 2017-09-25 NOTE — Telephone Encounter (Signed)
-----   Message from Romualdo Bolk, MD sent at 09/25/2017  4:07 PM EDT ----- The patient's vit d actually went down slightly. Check if she has been taking her vit d daily. Please call in 50,000 IU of vit d 3 weekly x 12 weeks. #12, no refills, then return for vit d level.

## 2017-09-25 NOTE — Telephone Encounter (Signed)
Spoke with patient and gave results. Sent in RX for Vitamin D. Patient is scheduled for 12 week recheck -eh

## 2017-11-28 ENCOUNTER — Telehealth: Payer: Self-pay | Admitting: Obstetrics and Gynecology

## 2017-11-28 DIAGNOSIS — Z30432 Encounter for removal of intrauterine contraceptive device: Secondary | ICD-10-CM

## 2017-11-28 NOTE — Telephone Encounter (Signed)
Spoke with patient, patient requesting to schedule IUD removal and repeat Vit D in same visit on 7/31. Advised Dr. Reyne DumasJerston is out of the office 7/29-8/2. OV scheduled for 8/7 at 1pm for IUD removal & labs. Patient does not desire contraceptive, is planning for pregnancy. Order placed for IUD removal. Patient aware will be notified of benefits. Patient verbalizes understanding.  Routing to provider for final review. Patient is agreeable to disposition. Will close encounter.  Cc: Soundra Pilonosa Davis, [address] [place]

## 2017-11-28 NOTE — Telephone Encounter (Signed)
Patient requesting to have IUD removed the same day that she has blood work done on 12/18/17.

## 2017-12-18 ENCOUNTER — Other Ambulatory Visit: Payer: Self-pay

## 2017-12-24 NOTE — Progress Notes (Signed)
GYNECOLOGY  VISIT   HPI: 22 y.o.   Married  Caucasian  female   G0P0000 with No LMP recorded. (Menstrual status: IUD).   here for Mirena IUD removal. Plans to get pregnant.  She has a h/o oligomenorrhea, she would get 1-2 cycles a year.  She has had normal prolactin and hypothyroidism.  No hirsutism, does report long term issues with acne.  Needs Vitamin D recheck as well, she has been on 50,000 IU of vit d weekly since May, 2019.   GYNECOLOGIC HISTORY: No LMP recorded. (Menstrual status: IUD). Contraception:Mirena IUD Menopausal hormone therapy: None        OB History    Gravida  0   Para  0   Term  0   Preterm  0   AB  0   Living  0     SAB  0   TAB  0   Ectopic  0   Multiple  0   Live Births  0              There are no active problems to display for this patient.   Past Medical History:  Diagnosis Date  . Anxiety     Past Surgical History:  Procedure Laterality Date  . TONSILLECTOMY      Current Outpatient Medications  Medication Sig Dispense Refill  . levonorgestrel (MIRENA, 52 MG,) 20 MCG/24HR IUD 1 each by Intrauterine route once.    . Vitamin D, Ergocalciferol, (DRISDOL) 50000 units CAPS capsule Take 1 capsule (50,000 Units total) by mouth every 7 (seven) days. 12 capsule 0   No current facility-administered medications for this visit.      ALLERGIES: Ciprofloxacin  Family History  Problem Relation Age of Onset  . Ovarian cysts Mother   . Ovarian cysts Sister   . Breast cancer Maternal Grandmother   . Diabetes Paternal Grandfather     Social History   Socioeconomic History  . Marital status: Married    Spouse name: Not on file  . Number of children: Not on file  . Years of education: Not on file  . Highest education level: Not on file  Occupational History  . Not on file  Social Needs  . Financial resource strain: Not on file  . Food insecurity:    Worry: Not on file    Inability: Not on file  . Transportation needs:     Medical: Not on file    Non-medical: Not on file  Tobacco Use  . Smoking status: Never Smoker  . Smokeless tobacco: Never Used  Substance and Sexual Activity  . Alcohol use: Yes    Alcohol/week: 0.0 - 0.6 oz  . Drug use: No  . Sexual activity: Yes    Partners: Male    Birth control/protection: IUD    Comment: Mirena   Lifestyle  . Physical activity:    Days per week: Not on file    Minutes per session: Not on file  . Stress: Not on file  Relationships  . Social connections:    Talks on phone: Not on file    Gets together: Not on file    Attends religious service: Not on file    Active member of club or organization: Not on file    Attends meetings of clubs or organizations: Not on file    Relationship status: Not on file  . Intimate partner violence:    Fear of current or ex partner: Not on file  Emotionally abused: Not on file    Physically abused: Not on file    Forced sexual activity: Not on file  Other Topics Concern  . Not on file  Social History Narrative  . Not on file    Review of Systems  Constitutional: Negative.   HENT: Negative.   Eyes: Negative.   Respiratory: Negative.   Cardiovascular: Negative.   Gastrointestinal: Negative.   Genitourinary: Negative.   Musculoskeletal: Negative.   Skin: Negative.   Neurological: Negative.   Endo/Heme/Allergies: Negative.   Psychiatric/Behavioral: Negative.     PHYSICAL EXAMINATION:    BP 124/72 (BP Location: Right Arm, Patient Position: Sitting)   Pulse 92   Wt 208 lb 3.2 oz (94.4 kg)   BMI 31.43 kg/m     General appearance: alert, cooperative and appears stated age  Pelvic: External genitalia:  no lesions              Urethra:  normal appearing urethra with no masses, tenderness or lesions              Bartholins and Skenes: normal                 Vagina: normal appearing vagina with normal color and discharge, no lesions              Cervix: no lesions and IUD string 3 cm. IUD removed with  ringed forceps               Chaperone was present for exam.  ASSESSMENT Preconception counseling IUD removal H/O oligomenorrhea H/O vit d def    PLAN IUD pulled Start PNV Vit d level Call if no cycle within 6 week. Discussed provera w/d, discussed clomid including risks of clomid use.  Given Up to date handout on Clomid use   An After Visit Summary was printed and given to the patient.  Over 15 minutes face to face time of which over 50% was spent in counseling.

## 2017-12-25 ENCOUNTER — Other Ambulatory Visit: Payer: Self-pay

## 2017-12-25 ENCOUNTER — Encounter: Payer: Self-pay | Admitting: Obstetrics and Gynecology

## 2017-12-25 ENCOUNTER — Ambulatory Visit (INDEPENDENT_AMBULATORY_CARE_PROVIDER_SITE_OTHER): Payer: BLUE CROSS/BLUE SHIELD | Admitting: Obstetrics and Gynecology

## 2017-12-25 VITALS — BP 124/72 | HR 92 | Wt 208.2 lb

## 2017-12-25 DIAGNOSIS — E559 Vitamin D deficiency, unspecified: Secondary | ICD-10-CM

## 2017-12-25 DIAGNOSIS — N915 Oligomenorrhea, unspecified: Secondary | ICD-10-CM | POA: Diagnosis not present

## 2017-12-25 DIAGNOSIS — Z3169 Encounter for other general counseling and advice on procreation: Secondary | ICD-10-CM

## 2017-12-25 DIAGNOSIS — Z30432 Encounter for removal of intrauterine contraceptive device: Secondary | ICD-10-CM

## 2017-12-25 NOTE — Patient Instructions (Addendum)
Preparing for Pregnancy If you are considering becoming pregnant, make an appointment to see your regular health care provider to learn how to prepare for a safe and healthy pregnancy (preconception care). During a preconception care visit, your health care provider will:  Do a complete physical exam, including a Pap test.  Take a complete medical history.  Give you information, answer your questions, and help you resolve problems.  Preconception checklist Medical history  Tell your health care provider about any current or past medical conditions. Your pregnancy or your ability to become pregnant may be affected by chronic conditions, such as diabetes, chronic hypertension, and thyroid problems.  Include your family's medical history as well as your partner's medical history.  Tell your health care provider about any history of STIs (sexually transmitted infections).These can affect your pregnancy. In some cases, they can be passed to your baby. Discuss any concerns that you have about STIs.  If indicated, discuss the benefits of genetic testing. This testing will show whether there are any genetic conditions that may be passed from you or your partner to your baby.  Tell your health care provider about: ? Any problems you have had with conception or pregnancy. ? Any medicines you take. These include vitamins, herbal supplements, and over-the-counter medicines. ? Your history of immunizations. Discuss any vaccinations that you may need.  Diet  Ask your health care provider what to include in a healthy diet that has a balance of nutrients. This is especially important when you are pregnant or preparing to become pregnant.  Ask your health care provider to help you reach a healthy weight before pregnancy. ? If you are overweight, you may be at higher risk for certain complications, such as high blood pressure, diabetes, and preterm birth. ? If you are underweight, you are more likely  to have a baby who has a low birth weight.  Lifestyle, work, and home  Let your health care provider know: ? About any lifestyle habits that you have, such as alcohol use, drug use, or smoking. ? About recreational activities that may put you at risk during pregnancy, such as downhill skiing and certain exercise programs. ? Tell your health care provider about any international travel, especially any travel to places with an active Zika virus outbreak. ? About harmful substances that you may be exposed to at work or at home. These include chemicals, pesticides, radiation, or even litter boxes. ? If you do not feel safe at home.  Mental health  Tell your health care provider about: ? Any history of mental health conditions, including feelings of depression, sadness, or anxiety. ? Any medicines that you take for a mental health condition. These include herbs and supplements.  Home instructions to prepare for pregnancy Lifestyle  Eat a balanced diet. This includes fresh fruits and vegetables, whole grains, lean meats, low-fat dairy products, healthy fats, and foods that are high in fiber. Ask to meet with a nutritionist or registered dietitian for assistance with meal planning and goals.  Get regular exercise. Try to be active for at least 30 minutes a day on most days of the week. Ask your health care provider which activities are safe during pregnancy.  Do not use any products that contain nicotine or tobacco, such as cigarettes and e-cigarettes. If you need help quitting, ask your health care provider.  Do not drink alcohol.  Do not take illegal drugs.  Maintain a healthy weight. Ask your health care provider what weight range is   right for you.  General instructions  Keep an accurate record of your menstrual periods. This makes it easier for your health care provider to determine your baby's due date.  Begin taking prenatal vitamins and folic acid supplements daily as directed by  your health care provider.  Manage any chronic conditions, such as high blood pressure and diabetes, as told by your health care provider. This is important.  How do I know that I am pregnant? You may be pregnant if you have been sexually active and you miss your period. Symptoms of early pregnancy include:  Mild cramping.  Very light vaginal bleeding (spotting).  Feeling unusually tired.  Nausea and vomiting (morning sickness).  If you have any of these symptoms and you suspect that you might be pregnant, you can take a home pregnancy test. These tests check for a hormone in your urine (human chorionic gonadotropin, or hCG). A woman's body begins to make this hormone during early pregnancy. These tests are very accurate. Wait until at least the first day after you miss your period to take one. If the test shows that you are pregnant (you get a positive result), call your health care provider to make an appointment for prenatal care. What should I do if I become pregnant?  Make an appointment with your health care provider as soon as you suspect you are pregnant.  Do not use any products that contain nicotine, such as cigarettes, chewing tobacco, and e-cigarettes. If you need help quitting, ask your health care provider.  Do not drink alcoholic beverages. Alcohol is related to a number of birth defects.  Avoid toxic odors and chemicals.  You may continue to have sexual intercourse if it does not cause pain or other problems, such as vaginal bleeding. This information is not intended to replace advice given to you by your health care provider. Make sure you discuss any questions you have with your health care provider. Document Released: 04/19/2008 Document Revised: 01/03/2016 Document Reviewed: 11/27/2015 Elsevier Interactive Patient Education  2018 [employer]  Call if you don't have a cycle within 6 weeks and I will call in provera for you to bring on a cycle  No unprotected  intercourse x 2 weeks  Check a pregnancy test, if negative, start provera  Call with cycle, if you don't bleed within a week of ending the provera call  On the 5th day of your cycle you would start clomid and take it for 5 days.    Start the BBT chart as soon as you finish clomid  You are most likely to ovulate 5-12 days after taking the clomid, intercourse every 24-36 hours starting 2-3 days after finishing the clomid

## 2017-12-26 LAB — VITAMIN D 25 HYDROXY (VIT D DEFICIENCY, FRACTURES): VIT D 25 HYDROXY: 34.5 ng/mL (ref 30.0–100.0)

## 2018-03-17 ENCOUNTER — Telehealth: Payer: Self-pay | Admitting: Obstetrics and Gynecology

## 2018-03-17 NOTE — Telephone Encounter (Signed)
Spoke with patient. Patient states she never received a call regarding Vit D labs DOS 12/25/17. Advised as seen below per Dr. Reyne Dumas. Patient verbalizes understanding and is agreeable. Encounter closed.     Notes recorded by Sprague, Caroleen Hamman, RN on 12/30/2017 at 9:58 AM EDT Left detailed message for patient, okay per ROI. Advised of results and recommendations. Advised to return call with any additional questions. ------  Notes recorded by Romualdo Bolk, MD on 12/27/2017 at 2:30 PM EDT Please advise the patient of normal results. She should take 1,000 IU of vit d daily (long term)

## 2018-03-17 NOTE — Telephone Encounter (Signed)
Patient is requesting vitamin D results and follow up instructions on dosage.

## 2018-03-24 NOTE — Progress Notes (Signed)
GYNECOLOGY  VISIT   HPI: 22 y.o.   Married White or Caucasian Not Hispanic or Latino  female   G0P0000 with Patient's last menstrual period was 02/16/2018.  here for pregnancy confirmation.  Took 4 UPT that were all positive. IUD was removed 12/25/2017. Had a cycle 12/29/2017-01/09/2018. Then another menses 02/16/2018-02/19/2018. Was not having cycles before IUD removal. She has a h/o oligomenorrhea prior to IUD insertion.  + UPT was positive on 03/21/18, had checked 2 weeks prior and it was negative. She has had tender breasts for 2 weeks. Slight nausea in the last few days. No vaginal bleeding, minimal cramping.  No h/o STD's. She is on PNV.    GYNECOLOGIC HISTORY: Patient's last menstrual period was 02/16/2018. Contraception: None Menopausal hormone therapy: None        OB History    Gravida  0   Para  0   Term  0   Preterm  0   AB  0   Living  0     SAB  0   TAB  0   Ectopic  0   Multiple  0   Live Births  0              There are no active problems to display for this patient.   Past Medical History:  Diagnosis Date  . Anxiety     Past Surgical History:  Procedure Laterality Date  . TONSILLECTOMY      No current outpatient medications on file.   No current facility-administered medications for this visit.      ALLERGIES: Ciprofloxacin  Family History  Problem Relation Age of Onset  . Ovarian cysts Mother   . Ovarian cysts Sister   . Breast cancer Maternal Grandmother   . Diabetes Paternal Grandfather     Social History   Socioeconomic History  . Marital status: Married    Spouse name: Not on file  . Number of children: Not on file  . Years of education: Not on file  . Highest education level: Not on file  Occupational History  . Not on file  Social Needs  . Financial resource strain: Not on file  . Food insecurity:    Worry: Not on file    Inability: Not on file  . Transportation needs:    Medical: Not on file    Non-medical: Not on  file  Tobacco Use  . Smoking status: Never Smoker  . Smokeless tobacco: Never Used  Substance and Sexual Activity  . Alcohol use: Yes    Alcohol/week: 0.0 - 1.0 standard drinks  . Drug use: No  . Sexual activity: Yes    Partners: Male    Birth control/protection: None  Lifestyle  . Physical activity:    Days per week: Not on file    Minutes per session: Not on file  . Stress: Not on file  Relationships  . Social connections:    Talks on phone: Not on file    Gets together: Not on file    Attends religious service: Not on file    Active member of club or organization: Not on file    Attends meetings of clubs or organizations: Not on file    Relationship status: Not on file  . Intimate partner violence:    Fear of current or ex partner: Not on file    Emotionally abused: Not on file    Physically abused: Not on file    Forced sexual activity: Not  on file  Other Topics Concern  . Not on file  Social History Narrative  . Not on file    Review of Systems  Constitutional: Negative.   HENT: Negative.   Eyes: Negative.   Respiratory: Negative.   Cardiovascular: Negative.   Gastrointestinal: Positive for nausea.  Genitourinary:       Light cramping  Musculoskeletal: Negative.   Skin: Negative.   Neurological: Negative.   Endo/Heme/Allergies: Negative.   Psychiatric/Behavioral: Negative.     PHYSICAL EXAMINATION:    BP 126/86 (BP Location: Right Arm, Patient Position: Sitting, Cuff Size: Normal)   Pulse 64   Wt 208 lb 3.2 oz (94.4 kg)   LMP 02/16/2018   BMI 31.43 kg/m     General appearance: alert, cooperative and appears stated age  ASSESSMENT Missed cycle, +UPT History of oligomenorrhea    PLAN F/U for an ultrasound in 2 weeks for viability and dating Names of OB's given. Continue PNV   An After Visit Summary was printed and given to the patient.

## 2018-03-25 ENCOUNTER — Ambulatory Visit (INDEPENDENT_AMBULATORY_CARE_PROVIDER_SITE_OTHER): Payer: BLUE CROSS/BLUE SHIELD | Admitting: Obstetrics and Gynecology

## 2018-03-25 ENCOUNTER — Encounter: Payer: Self-pay | Admitting: Obstetrics and Gynecology

## 2018-03-25 ENCOUNTER — Other Ambulatory Visit: Payer: Self-pay

## 2018-03-25 VITALS — BP 126/86 | HR 64 | Wt 208.2 lb

## 2018-03-25 DIAGNOSIS — Z3201 Encounter for pregnancy test, result positive: Secondary | ICD-10-CM

## 2018-03-25 DIAGNOSIS — N926 Irregular menstruation, unspecified: Secondary | ICD-10-CM

## 2018-03-25 LAB — POCT URINE PREGNANCY: Preg Test, Ur: POSITIVE — AB

## 2018-04-01 ENCOUNTER — Telehealth: Payer: Self-pay | Admitting: Obstetrics and Gynecology

## 2018-04-01 NOTE — Telephone Encounter (Signed)
Patient says she is waiting to hear back regarding scheduling an ultrasound.

## 2018-04-01 NOTE — Telephone Encounter (Signed)
Patient returned call and she is scheduled for viability ultrasound 04/08/18.   Advised she will be contacted with benefit information by tomorrow.

## 2018-04-01 NOTE — Telephone Encounter (Signed)
Message left to return call to [place] at [phone].   Can schedule ultrasound with Triage RN and then transfer to [place] or [place] for benefit information.

## 2018-04-02 NOTE — Telephone Encounter (Signed)
Call placed to patient to review benefits for scheduled ultrasound appointment.  Patient understood and agreeable. Patient scheduled 04/08/18 with Dr Oscar LaJertson. Patient aware of appointment date, arrival time and cancellation policy.  Patient is aware that pre-certification have been obtained for the ultrasound. The ultrasound authorization number is [id number].  No further questions. Ok to close

## 2018-04-08 ENCOUNTER — Ambulatory Visit (INDEPENDENT_AMBULATORY_CARE_PROVIDER_SITE_OTHER): Payer: BLUE CROSS/BLUE SHIELD

## 2018-04-08 ENCOUNTER — Ambulatory Visit: Payer: BLUE CROSS/BLUE SHIELD | Admitting: Obstetrics and Gynecology

## 2018-04-08 ENCOUNTER — Encounter: Payer: Self-pay | Admitting: Obstetrics and Gynecology

## 2018-04-08 ENCOUNTER — Other Ambulatory Visit: Payer: Self-pay

## 2018-04-08 VITALS — BP 120/62 | HR 68 | Wt 209.0 lb

## 2018-04-08 DIAGNOSIS — Z3201 Encounter for pregnancy test, result positive: Secondary | ICD-10-CM | POA: Diagnosis not present

## 2018-04-08 DIAGNOSIS — N926 Irregular menstruation, unspecified: Secondary | ICD-10-CM | POA: Diagnosis not present

## 2018-04-08 DIAGNOSIS — Z3491 Encounter for supervision of normal pregnancy, unspecified, first trimester: Secondary | ICD-10-CM

## 2018-04-08 NOTE — Progress Notes (Signed)
GYNECOLOGY  VISIT   HPI: 22 y.o.   Married White or Caucasian Not Hispanic or Latino  female   G0P0000 with No LMP recorded.   here for consult following viability scan. She is doing well. Her acne is worse, she is more emotional. No bleeding.  GYNECOLOGIC HISTORY: No LMP recorded. Contraception: None Menopausal hormone therapy: None        OB History    Gravida  0   Para  0   Term  0   Preterm  0   AB  0   Living  0     SAB  0   TAB  0   Ectopic  0   Multiple  0   Live Births  0              There are no active problems to display for this patient.   Past Medical History:  Diagnosis Date  . Anxiety     Past Surgical History:  Procedure Laterality Date  . TONSILLECTOMY      No current outpatient medications on file.   No current facility-administered medications for this visit.      ALLERGIES: Ciprofloxacin  Family History  Problem Relation Age of Onset  . Ovarian cysts Mother   . Ovarian cysts Sister   . Breast cancer Maternal Grandmother   . Diabetes Paternal Grandfather     Social History   Socioeconomic History  . Marital status: Married    Spouse name: Not on file  . Number of children: Not on file  . Years of education: Not on file  . Highest education level: Not on file  Occupational History  . Not on file  Social Needs  . Financial resource strain: Not on file  . Food insecurity:    Worry: Not on file    Inability: Not on file  . Transportation needs:    Medical: Not on file    Non-medical: Not on file  Tobacco Use  . Smoking status: Never Smoker  . Smokeless tobacco: Never Used  Substance and Sexual Activity  . Alcohol use: Yes    Alcohol/week: 0.0 - 1.0 standard drinks  . Drug use: No  . Sexual activity: Yes    Partners: Male    Birth control/protection: None  Lifestyle  . Physical activity:    Days per week: Not on file    Minutes per session: Not on file  . Stress: Not on file  Relationships  . Social  connections:    Talks on phone: Not on file    Gets together: Not on file    Attends religious service: Not on file    Active member of club or organization: Not on file    Attends meetings of clubs or organizations: Not on file    Relationship status: Not on file  . Intimate partner violence:    Fear of current or ex partner: Not on file    Emotionally abused: Not on file    Physically abused: Not on file    Forced sexual activity: Not on file  Other Topics Concern  . Not on file  Social History Narrative  . Not on file    Review of Systems  Constitutional: Negative.   HENT: Negative.   Eyes: Negative.   Respiratory: Negative.   Cardiovascular: Negative.   Gastrointestinal: Positive for nausea.  Genitourinary: Positive for frequency.  Musculoskeletal: Negative.        Breast pain  Skin: Negative.  Neurological: Negative.   Endo/Heme/Allergies: Negative.   Psychiatric/Behavioral: Negative.     PHYSICAL EXAMINATION:    BP 120/62 (BP Location: Right Arm, Patient Position: Sitting, Cuff Size: Normal)   Pulse 68   Wt 209 lb (94.8 kg)   BMI 31.55 kg/m     General appearance: alert, cooperative and appears stated age  Ultrasound images reviewed with the patient and her husband, viable IUP, c/w 7+3 weeks, EDD of 11/22/18, FHR 148. Small CL on the left. LMP 7+4, EDD 11/21/18  ASSESSMENT Viable IUP c/w LMP, EDD 11/21/18    PLAN She is establishing care with OB Has her first appointment today   An After Visit Summary was printed and given to the patient.

## 2018-04-10 LAB — OB RESULTS CONSOLE HIV ANTIBODY (ROUTINE TESTING): HIV: NONREACTIVE

## 2018-04-10 LAB — OB RESULTS CONSOLE RUBELLA ANTIBODY, IGM: Rubella: IMMUNE

## 2018-04-10 LAB — OB RESULTS CONSOLE RPR: RPR: NONREACTIVE

## 2018-04-10 LAB — OB RESULTS CONSOLE HEPATITIS B SURFACE ANTIGEN: Hepatitis B Surface Ag: NEGATIVE

## 2018-04-10 LAB — OB RESULTS CONSOLE ABO/RH: RH Type: POSITIVE

## 2018-04-10 LAB — OB RESULTS CONSOLE ANTIBODY SCREEN: Antibody Screen: NEGATIVE

## 2018-05-21 NOTE — L&D Delivery Note (Signed)
Delivery Note At 10:32 PM a viable female, "Mckenzie Reeves," was delivered via Vaginal, Spontaneous (Presentation: Direct OA).  APGAR: 8, 9; weight pending .   Placenta status:Spontaneous, intact .  Cord:  with the following complications:  marginal insertion of cord.  Cord pH: N/a  Pt had active bleeding from a periclitoral tear.  Held pressure, when possible to tamponade.  Red rubber catheter inserted with sterile technique to avoid injury. Laceration hemostatic after the first two stiches.  Right labial laceration repaired, left labial hemostatic.    Anesthesia:  Epidural Episiotomy: None Lacerations: 2nd degree;Periclitoral, bilateral labial Suture Repair: 2.0 3.0 chromic Est. Blood Loss (mL):  503  Mom to postpartum.  Baby to Couplet care / Skin to Skin.  All counts correct.  Thurnell Lose, MD 11/20/2018, 11:12 PM

## 2018-11-18 ENCOUNTER — Telehealth (HOSPITAL_COMMUNITY): Payer: Self-pay | Admitting: *Deleted

## 2018-11-19 ENCOUNTER — Encounter (HOSPITAL_COMMUNITY): Payer: Self-pay | Admitting: *Deleted

## 2018-11-19 NOTE — Telephone Encounter (Signed)
Preadmission screen  

## 2018-11-20 ENCOUNTER — Inpatient Hospital Stay (HOSPITAL_COMMUNITY)
Admission: AD | Admit: 2018-11-20 | Discharge: 2018-11-22 | DRG: 807 | Disposition: A | Discharge status: Home / Self Care | Payer: Managed Care, Other (non HMO) | Attending: Obstetrics & Gynecology | Admitting: Obstetrics & Gynecology

## 2018-11-20 ENCOUNTER — Encounter (HOSPITAL_COMMUNITY): Payer: Self-pay

## 2018-11-20 ENCOUNTER — Other Ambulatory Visit: Payer: Self-pay

## 2018-11-20 ENCOUNTER — Inpatient Hospital Stay (HOSPITAL_COMMUNITY): Payer: Managed Care, Other (non HMO) | Admitting: Anesthesiology

## 2018-11-20 ENCOUNTER — Inpatient Hospital Stay (HOSPITAL_COMMUNITY): Admission: AD | Admit: 2018-11-20 | Payer: Self-pay | Source: Home / Self Care | Admitting: Obstetrics and Gynecology

## 2018-11-20 DIAGNOSIS — O9962 Diseases of the digestive system complicating childbirth: Secondary | ICD-10-CM | POA: Diagnosis present

## 2018-11-20 DIAGNOSIS — Z3A39 39 weeks gestation of pregnancy: Secondary | ICD-10-CM | POA: Diagnosis not present

## 2018-11-20 DIAGNOSIS — O43123 Velamentous insertion of umbilical cord, third trimester: Secondary | ICD-10-CM | POA: Diagnosis present

## 2018-11-20 DIAGNOSIS — K219 Gastro-esophageal reflux disease without esophagitis: Secondary | ICD-10-CM | POA: Diagnosis present

## 2018-11-20 DIAGNOSIS — Z1159 Encounter for screening for other viral diseases: Secondary | ICD-10-CM

## 2018-11-20 DIAGNOSIS — O26893 Other specified pregnancy related conditions, third trimester: Secondary | ICD-10-CM | POA: Diagnosis present

## 2018-11-20 HISTORY — DX: Anemia, unspecified: D64.9

## 2018-11-20 LAB — CBC
HCT: 39.8 % (ref 36.0–46.0)
Hemoglobin: 13.5 g/dL (ref 12.0–15.0)
MCH: 30.1 pg (ref 26.0–34.0)
MCHC: 33.9 g/dL (ref 30.0–36.0)
MCV: 88.8 fL (ref 80.0–100.0)
Platelets: 218 10*3/uL (ref 150–400)
RBC: 4.48 MIL/uL (ref 3.87–5.11)
RDW: 13.9 % (ref 11.5–15.5)
WBC: 15.8 10*3/uL — ABNORMAL HIGH (ref 4.0–10.5)
nRBC: 0 % (ref 0.0–0.2)

## 2018-11-20 LAB — SARS CORONAVIRUS 2 BY RT PCR (HOSPITAL ORDER, PERFORMED IN ~~LOC~~ HOSPITAL LAB): SARS Coronavirus 2: NEGATIVE

## 2018-11-20 MED ORDER — BUTORPHANOL TARTRATE 1 MG/ML IJ SOLN
1.0000 mg | INTRAMUSCULAR | Status: DC | PRN
  Administered 2018-11-20: 1 mg via INTRAVENOUS
  Filled 2018-11-20: qty 1

## 2018-11-20 MED ORDER — DIPHENHYDRAMINE HCL 50 MG/ML IJ SOLN: 12.5000 mg | INTRAMUSCULAR | Status: DC | PRN

## 2018-11-20 MED ORDER — LACTATED RINGERS IV SOLN
INTRAVENOUS | Status: DC
  Administered 2018-11-20 (×2): via INTRAVENOUS

## 2018-11-20 MED ORDER — EPHEDRINE 5 MG/ML INJ: 10.0000 mg | INTRAVENOUS | Status: DC | PRN

## 2018-11-20 MED ORDER — OXYTOCIN 40 UNITS IN NORMAL SALINE INFUSION - SIMPLE MED
2.5000 [IU]/h | INTRAVENOUS | Status: DC
  Filled 2018-11-20: qty 1000

## 2018-11-20 MED ORDER — TERBUTALINE SULFATE 1 MG/ML IJ SOLN: 0.2500 mg | Freq: Once | INTRAMUSCULAR | Status: DC | PRN

## 2018-11-20 MED ORDER — LIDOCAINE HCL (PF) 1 % IJ SOLN: 30.0000 mL | INTRAMUSCULAR | Status: DC | PRN

## 2018-11-20 MED ORDER — ACETAMINOPHEN 325 MG PO TABS: 650.0000 mg | ORAL_TABLET | ORAL | Status: DC | PRN

## 2018-11-20 MED ORDER — FAMOTIDINE IN NACL 20-0.9 MG/50ML-% IV SOLN
20.0000 mg | Freq: Two times a day (BID) | INTRAVENOUS | Status: DC
  Administered 2018-11-20: 20 mg via INTRAVENOUS
  Filled 2018-11-20: qty 50

## 2018-11-20 MED ORDER — FENTANYL-BUPIVACAINE-NACL 0.5-0.125-0.9 MG/250ML-% EP SOLN
12.0000 mL/h | EPIDURAL | Status: DC | PRN
  Filled 2018-11-20: qty 250

## 2018-11-20 MED ORDER — ONDANSETRON HCL 4 MG/2ML IJ SOLN: 4.0000 mg | Freq: Four times a day (QID) | INTRAMUSCULAR | Status: DC | PRN

## 2018-11-20 MED ORDER — LACTATED RINGERS IV SOLN: 500.0000 mL | INTRAVENOUS | Status: DC | PRN

## 2018-11-20 MED ORDER — OXYCODONE-ACETAMINOPHEN 5-325 MG PO TABS: 1.0000 | ORAL_TABLET | ORAL | Status: DC | PRN

## 2018-11-20 MED ORDER — SODIUM CHLORIDE (PF) 0.9 % IJ SOLN
INTRAMUSCULAR | Status: DC | PRN
  Administered 2018-11-20: 12 mL/h via EPIDURAL

## 2018-11-20 MED ORDER — PHENYLEPHRINE 40 MCG/ML (10ML) SYRINGE FOR IV PUSH (FOR BLOOD PRESSURE SUPPORT): 80.0000 ug | PREFILLED_SYRINGE | INTRAVENOUS | Status: DC | PRN

## 2018-11-20 MED ORDER — MISOPROSTOL 25 MCG QUARTER TABLET: 25.0000 ug | ORAL_TABLET | ORAL | Status: DC | PRN

## 2018-11-20 MED ORDER — OXYTOCIN BOLUS FROM INFUSION
500.0000 mL | Freq: Once | INTRAVENOUS | Status: AC
  Administered 2018-11-20: 23:00:00 500 mL via INTRAVENOUS

## 2018-11-20 MED ORDER — LACTATED RINGERS IV SOLN
500.0000 mL | Freq: Once | INTRAVENOUS | Status: AC
  Administered 2018-11-20: 500 mL via INTRAVENOUS

## 2018-11-20 MED ORDER — LIDOCAINE HCL (PF) 1 % IJ SOLN
INTRAMUSCULAR | Status: AC
  Filled 2018-11-20: qty 30

## 2018-11-20 MED ORDER — OXYCODONE-ACETAMINOPHEN 5-325 MG PO TABS: 2.0000 | ORAL_TABLET | ORAL | Status: DC | PRN

## 2018-11-20 MED ORDER — SOD CITRATE-CITRIC ACID 500-334 MG/5ML PO SOLN: 30.0000 mL | ORAL | Status: DC | PRN

## 2018-11-20 MED ORDER — OXYTOCIN 40 UNITS IN NORMAL SALINE INFUSION - SIMPLE MED
1.0000 m[IU]/min | INTRAVENOUS | Status: DC
  Administered 2018-11-20: 2 m[IU]/min via INTRAVENOUS

## 2018-11-20 MED ORDER — LACTATED RINGERS IV SOLN: 500.0000 mL | Freq: Once | INTRAVENOUS | Status: DC

## 2018-11-20 MED ORDER — LIDOCAINE HCL (PF) 1 % IJ SOLN
INTRAMUSCULAR | Status: DC | PRN
  Administered 2018-11-20: 3 mL via EPIDURAL
  Administered 2018-11-20: 5 mL via EPIDURAL
  Administered 2018-11-20: 2 mL via EPIDURAL

## 2018-11-20 NOTE — Progress Notes (Signed)
   11/20/18 1307  Cervical Exam  Dilation 3  Effacement (%) 80  Cervical Position Posterior  Cervical Consistency Medium  Station -2  Presentation Vertex  Exam by: Elza Rafter rnc  Dr Nelda Marseille aware of pt arrival & history.  Report of above repeat assessment call to MD, orders rec'd to admit to L&D.

## 2018-11-20 NOTE — Anesthesia Preprocedure Evaluation (Signed)
Anesthesia Evaluation  Patient identified by MRN, date of birth, ID band Patient awake    Reviewed: Allergy & Precautions, Patient's Chart, lab work & pertinent test results  Airway Mallampati: II       Dental   Pulmonary    Pulmonary exam normal        Cardiovascular Normal cardiovascular exam     Neuro/Psych    GI/Hepatic   Endo/Other    Renal/GU      Musculoskeletal   Abdominal   Peds  Hematology   Anesthesia Other Findings   Reproductive/Obstetrics (+) Pregnancy                             Anesthesia Physical Anesthesia Plan  ASA: II  Anesthesia Plan: Epidural   Post-op Pain Management:    Induction:   PONV Risk Score and Plan: Treatment may vary due to age or medical condition  Airway Management Planned: Natural Airway  Additional Equipment:   Intra-op Plan:   Post-operative Plan:   Informed Consent: I have reviewed the patients History and Physical, chart, labs and discussed the procedure including the risks, benefits and alternatives for the proposed anesthesia with the patient or authorized representative who has indicated his/her understanding and acceptance.       Plan Discussed with:   Anesthesia Plan Comments:         Anesthesia Quick Evaluation

## 2018-11-20 NOTE — MAU Note (Signed)
Pt states she began having UCs last pm, however, they have gotten more intense & frequent since then.  Denies vag bleeding/LOF.  States + FM.

## 2018-11-20 NOTE — Anesthesia Procedure Notes (Signed)
Epidural Patient location during procedure: OB Start time: 11/20/2018 3:42 PM End time: 11/20/2018 3:47 PM  Staffing Anesthesiologist: Suzette Battiest, MD Performed: anesthesiologist   Preanesthetic Checklist Completed: patient identified, site marked, surgical consent, pre-op evaluation, timeout performed, IV checked, risks and benefits discussed and monitors and equipment checked  Epidural Patient position: sitting Prep: site prepped and draped and DuraPrep Patient monitoring: continuous pulse ox and blood pressure Approach: midline Location: L4-L5 Injection technique: LOR air  Needle:  Needle type: Tuohy  Needle gauge: 17 G Needle length: 9 cm and 9 Needle insertion depth: 6 cm Catheter type: closed end flexible Catheter size: 19 Gauge Catheter at skin depth: 11 cm Test dose: negative  Assessment Events: blood not aspirated, injection not painful, no injection resistance, negative IV test and no paresthesia

## 2018-11-20 NOTE — Progress Notes (Signed)
   11/20/18 1132 11/20/18 1133 11/20/18 1141  Fetal Heart Rate A  Mode External  --  External  Baseline Rate (A) 125 bpm  --  125 bpm  Variability  --   --  6-25 BPM  Accelerations  --   --  15 x 15  Decelerations  --   --  None  Uterine Activity  Mode  --   --  Toco  Contraction Frequency (min)  --   --  3-5  Contraction Duration (sec)  --   --  60-70  Contraction Quality  --   --  Mild  Resting Tone Palpated  --   --  Relaxed  Resting Time  --   --  Adequate  Cervical Exam  Dilation 2  --   --   Effacement (%) 80  --   --   Cervical Position Posterior  --   --   Cervical Consistency Soft  --   --   Vag. Bleeding None None  --   Station -2  --   --   Presentation Vertex  --   --   Above assessment to include vitals & pt background reported to [place].  Orders rec'd to allow pt to ambulate & recheck cervix x1hr.

## 2018-11-20 NOTE — Progress Notes (Addendum)
Mckenzie Reeves is a 23 y.o. G1P0000 at [redacted]w[redacted]d   Subjective: Pt comfortable with epidural.  Recently vomited which she thought was due to her reflux and also reports shaking.  After AROM, contractions are stronger.  Pt is requesting medication for reflux.  She took Tums throughout the pregnancy.  Objective: BP 115/71   Pulse (!) 104   Temp 98.8 F (37.1 C) (Oral)   Resp 20   Ht 5\' 9"  (1.753 m)   Wt 109.3 kg   LMP 02/16/2018   SpO2 98%   BMI 35.59 kg/m  I/O last 3 completed shifts: In: 947.5 [I.V.:947.5] Out: 1000 [Urine:1000] No intake/output data recorded.  Gen:  Body shaking c/w active labor, NAD, A&O x 3 FHT:  FHR: 140s bpm, variability: moderate,  accelerations:  Present,  decelerations:  Absent UC:   Difficult to assess with external tocometer.  After IUPC, q 2 minutes SVE:   Dilation: 6 Effacement (%): 80 Station: 0 Exam by:: Dr. Simona Huh  AROM clear.  ~ 15 minutes after rupturing pt, I placed an IUPC b/c contraction pattern was still difficult to assess.  Cervix felt 8-9 at that time.  Labs: Lab Results  Component Value Date   WBC 15.8 (H) 11/20/2018   HGB 13.5 11/20/2018   HCT 39.8 11/20/2018   MCV 88.8 11/20/2018   PLT 218 11/20/2018    Assessment / Plan: IUP @ 39 4/7 weeks.  Reflux-IV Pepcid 20 mg q 12 hours. Labor: Progressing well on Pitocin.  S/p AROM. Preeclampsia:  BP normal Fetal Wellbeing:  Category I Pain Control:  Epidural I/D:  GBS negative Anticipated MOD:  NSVD  Mckenzie Reeves 11/20/2018, 8:40 PM

## 2018-11-20 NOTE — H&P (Signed)
HPI: 23 y/o G1P0 @ [redacted]w[redacted]d estimated gestational age (as dated by LMP c/w 20 week ultrasound) presents complaining of painful contraction since this am around 9.   no Leaking of Fluid,   no Vaginal Bleeding,   + Uterine Contractions,  + Fetal Movement.  Prenatal care has been provided by Dr. Nelda Marseille  ROS: no HA, no epigastric pain, no visual changes.    Pregnancy uncomplicated   Prenatal Transfer Tool  Maternal Diabetes: No Genetic Screening: Normal Maternal Ultrasounds/Referrals: Normal Fetal Ultrasounds or other Referrals:  None Maternal Substance Abuse:  No Significant Maternal Medications:  None Significant Maternal Lab Results: Group B Strep negative   PNL:  GBS negative, Rub Immune, Hep B neg, RPR NR, HIV neg, GC/C neg, glucola:101 Hgb: 11.6 Blood type: B positive, antibody neg  Immunizations: Tdap: 5/28 Flu: outside facility  OBHx: primip PMHx:  none Meds:  PNV Allergy:   Allergies  Allergen Reactions  . Ciprofloxacin    SurgHx: none SocHx:   no Tobacco, no  EtOH, no Illicit Drugs  O: BP 478/29 (BP Location: Right Arm)   Pulse 79   Temp 98.1 F (36.7 C) (Oral)   Resp 18   LMP 02/16/2018   SpO2 98%  Gen. AAOx3, NAD CV.  RRR  No murmur.  Resp. CTAB, no wheeze or crackles. Abd. Gravid,  no tenderness,  no rigidity,  no guarding Extr.  no edema B/L , no calf tenderness, neg Homan's B/L FHT: 140 baseline, moderate variability, + accels,  no decels Toco: q 3 min SVE: 3/80/-2, vertex   Labs:  Results for orders placed or performed during the hospital encounter of 11/20/18 (from the past 24 hour(s))  CBC     Status: Abnormal   Collection Time: 11/20/18  1:21 PM  Result Value Ref Range   WBC 15.8 (H) 4.0 - 10.5 K/uL   RBC 4.48 3.87 - 5.11 MIL/uL   Hemoglobin 13.5 12.0 - 15.0 g/dL   HCT 39.8 36.0 - 46.0 %   MCV 88.8 80.0 - 100.0 fL   MCH 30.1 26.0 - 34.0 pg   MCHC 33.9 30.0 - 36.0 g/dL   RDW 13.9 11.5 - 15.5 %   Platelets 218 150 - 400 K/uL   nRBC 0.0 0.0  - 0.2 %  SARS Coronavirus 2 (CEPHEID - Performed in [place] hospital lab), Hosp Order     Status: None   Collection Time: 11/20/18  1:21 PM   Specimen: Nasopharyngeal Swab  Result Value Ref Range   SARS Coronavirus 2 NEGATIVE NEGATIVE  Type and screen [place]     Status: None   Collection Time: 11/20/18  1:52 PM  Result Value Ref Range   ABO/RH(D) B POS    Antibody Screen NEG    Sample Expiration      11/23/2018,2359 Performed at [facility], [place] [address], [place], [place] [address]      A/P:  23 y.o. G1P0 @ [redacted]w[redacted]d EGA who presents for early labor -FWB:  NICHD Cat I FHTs -Labor: no further cervical change, plan to start Pitocin -GBS: negative -Pain management: epidural placed  Dr. Simona Huh to take over care  Janyth Pupa, DO [phone] (cell) [phone] (office)

## 2018-11-21 ENCOUNTER — Other Ambulatory Visit (HOSPITAL_COMMUNITY)
Admission: RE | Admit: 2018-11-21 | Discharge: 2018-11-21 | Disposition: A | Discharge status: Home / Self Care | Payer: Managed Care, Other (non HMO) | Source: Ambulatory Visit | Attending: Family Medicine | Admitting: Family Medicine

## 2018-11-21 LAB — TYPE AND SCREEN
ABO/RH(D): B POS
Antibody Screen: NEGATIVE

## 2018-11-21 LAB — CBC
HCT: 32.1 % — ABNORMAL LOW (ref 36.0–46.0)
Hemoglobin: 10.8 g/dL — ABNORMAL LOW (ref 12.0–15.0)
MCH: 30 pg (ref 26.0–34.0)
MCHC: 33.6 g/dL (ref 30.0–36.0)
MCV: 89.2 fL (ref 80.0–100.0)
Platelets: 190 10*3/uL (ref 150–400)
RBC: 3.6 MIL/uL — ABNORMAL LOW (ref 3.87–5.11)
RDW: 13.9 % (ref 11.5–15.5)
WBC: 17.4 10*3/uL — ABNORMAL HIGH (ref 4.0–10.5)
nRBC: 0 % (ref 0.0–0.2)

## 2018-11-21 LAB — ABO/RH: ABO/RH(D): B POS

## 2018-11-21 LAB — RPR: RPR Ser Ql: NONREACTIVE

## 2018-11-21 MED ORDER — ONDANSETRON HCL 4 MG/2ML IJ SOLN: 4.0000 mg | INTRAMUSCULAR | Status: DC | PRN

## 2018-11-21 MED ORDER — OXYCODONE HCL 5 MG PO TABS: 10.0000 mg | ORAL_TABLET | ORAL | Status: DC | PRN

## 2018-11-21 MED ORDER — DIBUCAINE (PERIANAL) 1 % EX OINT: 1.0000 "application " | TOPICAL_OINTMENT | CUTANEOUS | Status: DC | PRN

## 2018-11-21 MED ORDER — METHYLERGONOVINE MALEATE 0.2 MG PO TABS: 0.2000 mg | ORAL_TABLET | ORAL | Status: DC | PRN

## 2018-11-21 MED ORDER — SENNOSIDES-DOCUSATE SODIUM 8.6-50 MG PO TABS
2.0000 | ORAL_TABLET | ORAL | Status: DC
  Administered 2018-11-21 – 2018-11-22 (×2): 2 via ORAL
  Filled 2018-11-21 (×2): qty 2

## 2018-11-21 MED ORDER — PRENATAL MULTIVITAMIN CH
1.0000 | ORAL_TABLET | Freq: Every day | ORAL | Status: DC
  Administered 2018-11-21 – 2018-11-22 (×2): 1 via ORAL
  Filled 2018-11-21 (×2): qty 1

## 2018-11-21 MED ORDER — METHYLERGONOVINE MALEATE 0.2 MG/ML IJ SOLN: 0.2000 mg | INTRAMUSCULAR | Status: DC | PRN

## 2018-11-21 MED ORDER — DIPHENHYDRAMINE HCL 25 MG PO CAPS: 25.0000 mg | ORAL_CAPSULE | Freq: Four times a day (QID) | ORAL | Status: DC | PRN

## 2018-11-21 MED ORDER — ONDANSETRON HCL 4 MG PO TABS: 4.0000 mg | ORAL_TABLET | ORAL | Status: DC | PRN

## 2018-11-21 MED ORDER — COCONUT OIL OIL: 1.0000 "application " | TOPICAL_OIL | Status: DC | PRN

## 2018-11-21 MED ORDER — OXYTOCIN 40 UNITS IN NORMAL SALINE INFUSION - SIMPLE MED: 2.5000 [IU]/h | INTRAVENOUS | Status: DC | PRN

## 2018-11-21 MED ORDER — BENZOCAINE-MENTHOL 20-0.5 % EX AERO
1.0000 "application " | INHALATION_SPRAY | CUTANEOUS | Status: DC | PRN
  Administered 2018-11-21: 1 via TOPICAL
  Filled 2018-11-21: qty 56

## 2018-11-21 MED ORDER — ZOLPIDEM TARTRATE 5 MG PO TABS: 5.0000 mg | ORAL_TABLET | Freq: Every evening | ORAL | Status: DC | PRN

## 2018-11-21 MED ORDER — MAGNESIUM HYDROXIDE 400 MG/5ML PO SUSP: 30.0000 mL | ORAL | Status: DC | PRN

## 2018-11-21 MED ORDER — OXYCODONE HCL 5 MG PO TABS: 5.0000 mg | ORAL_TABLET | ORAL | Status: DC | PRN

## 2018-11-21 MED ORDER — WITCH HAZEL-GLYCERIN EX PADS: 1.0000 "application " | MEDICATED_PAD | CUTANEOUS | Status: DC | PRN

## 2018-11-21 MED ORDER — IBUPROFEN 600 MG PO TABS
600.0000 mg | ORAL_TABLET | Freq: Four times a day (QID) | ORAL | Status: DC
  Administered 2018-11-21 – 2018-11-22 (×7): 600 mg via ORAL
  Filled 2018-11-21 (×7): qty 1

## 2018-11-21 MED ORDER — SIMETHICONE 80 MG PO CHEW: 80.0000 mg | CHEWABLE_TABLET | ORAL | Status: DC | PRN

## 2018-11-21 MED ORDER — FERROUS SULFATE 325 (65 FE) MG PO TABS
325.0000 mg | ORAL_TABLET | Freq: Two times a day (BID) | ORAL | Status: DC
  Administered 2018-11-21 – 2018-11-22 (×3): 325 mg via ORAL
  Filled 2018-11-21 (×3): qty 1

## 2018-11-21 MED ORDER — ACETAMINOPHEN 325 MG PO TABS
650.0000 mg | ORAL_TABLET | ORAL | Status: DC | PRN
  Administered 2018-11-21: 650 mg via ORAL
  Filled 2018-11-21: qty 2

## 2018-11-21 MED ORDER — TETANUS-DIPHTH-ACELL PERTUSSIS 5-2.5-18.5 LF-MCG/0.5 IM SUSP: 0.5000 mL | Freq: Once | INTRAMUSCULAR | Status: DC

## 2018-11-21 NOTE — Anesthesia Postprocedure Evaluation (Signed)
Anesthesia Post Note  Patient: Mckenzie Reeves  Procedure(s) Performed: AN AD HOC LABOR EPIDURAL     Patient location during evaluation: Mother Baby Anesthesia Type: Epidural Level of consciousness: awake and alert Pain management: pain level controlled Vital Signs Assessment: post-procedure vital signs reviewed and stable Respiratory status: spontaneous breathing Cardiovascular status: blood pressure returned to baseline Postop Assessment: no headache Anesthetic complications: no    Last Vitals: There were no vitals filed for this visit.  Last Pain: There were no vitals filed for this visit.               Watson Robarge

## 2018-11-21 NOTE — Progress Notes (Signed)
CSW received consult for history of anxiety.  CSW met with [name] to offer support and complete assessment.    [name] sitting up in bed with FOB present at bedside holding infant, when CSW entered the room. CSW introduced self and received verbal permission from [name] to complete assessment with FOB present. [name] very pleasant and engaged throughout assessment. CSW inquired about [name]'s mental health history and [name] acknowledged having a history of anxiety since she was a child. [name] reported some depression during her 2nd trimester but attributed symptoms to her hormones. [name] reported she has a very good relationship with her OBGYN and feels comfortable reaching out if any symptoms come up. CSW provided education regarding the baby blues period vs. perinatal mood disorders, discussed treatment and gave resources for mental health follow up if concerns arise.  CSW recommends self-evaluation during the postpartum time period using the New Mom Checklist from Postpartum Progress and encouraged [name] to contact a medical professional if symptoms are noted at any time. [name] denied any current SI or HI and reported having a very good support system consisting of both her family and FOB's family.   [name] confirmed having all essential items for infant once discharged and reported infant would be sleeping in a basinet once home. CSW provided review of Sudden Infant Death Syndrome (SIDS) precautions and safe sleeping habits.    CSW identifies no further need for intervention and no barriers to discharge at this time.  Mckenzie Reeves, LCSWA  [facility] [phone]   

## 2018-11-21 NOTE — Progress Notes (Signed)
Post Partum Day 1 SVD Subjective: no complaints, up ad lib, voiding and tolerating PO  Objective: Blood pressure 104/65, pulse 72, temperature 98.8 F (37.1 C), temperature source Oral, resp. rate 20, height 5\' 9"  (1.753 m), weight 109.3 kg, last menstrual period 02/16/2018, SpO2 100 %, unknown if currently breastfeeding.  Physical Exam:  General: alert, cooperative and no distress Lochia: appropriate Uterine Fundus: firm Incision: NA DVT Evaluation: No evidence of DVT seen on physical exam.  Recent Labs    11/20/18 1321 11/21/18 0415  HGB 13.5 10.8*  HCT 39.8 32.1*    Assessment/Plan: Plan for discharge tomorrow and Breastfeeding  Routine pospartum care  Pain well controlled continue motrin scheduled.    LOS: 1 day   Christophe Louis 11/21/2018, 10:08 AM

## 2018-11-22 NOTE — Lactation Note (Signed)
This note was copied from a baby's chart. Lactation Consultation Note  Patient Name: Girl Nicolemarie Wooley Today's Date: 11/22/2018   P1, Baby 85 hours old.  Mother's nipples are sore and she has comfort gels. Mother is tearful and states she had very little sleep last night. Baby cluster fed. Reviewed hand expression with drops expressed. For soreness suggest mother apply ebm or coconut oil while wearing shells and alternate with comfort gels. Mother requested a nipple shield.  #20NS was given to try with next feeding. Baby sleeping so suggest mother call for lactation when needed. Discussed pumping in addition to breastfeeding. Mother started supplementing last night due to exhaustion. Feed on demand approximately 8-12 times per day.   Reviewed engorgement care and monitoring voids/stools.      Maternal Data    Feeding Feeding Type: Breast Fed  LATCH Score Latch: Grasps breast easily, tongue down, lips flanged, rhythmical sucking.  Audible Swallowing: A few with stimulation  Type of Nipple: Everted at rest and after stimulation  Comfort (Breast/Nipple): Filling, red/small blisters or bruises, mild/mod discomfort  Hold (Positioning): No assistance needed to correctly position infant at breast.  LATCH Score: 8  Interventions Interventions: Comfort gels;Position options;Coconut oil  Lactation Tools Discussed/Used     Consult Status      Carlye Grippe 11/22/2018, 10:39 AM

## 2018-11-22 NOTE — Discharge Instructions (Signed)
Take Ibuprofen 600 mg every 6 to 8 hours for pain Take Acetaminophen 650 mg every 6 hours pain Continue prenatal vitamin til done nursing

## 2018-11-22 NOTE — Plan of Care (Signed)
Patient appropriate for discharge.

## 2018-11-22 NOTE — Discharge Summary (Signed)
Obstetric Discharge Summary Reason for Admission: onset of labor Prenatal Procedures: none Intrapartum Procedures: spontaneous vaginal delivery Postpartum Procedures: none Complications-Operative and Postpartum:  2nd laceration Hemoglobin  Date Value Ref Range Status  11/21/2018 10.8 (L) 12.0 - 15.0 g/dL Final  07/01/2017 13.1 11.1 - 15.9 g/dL Final   HCT  Date Value Ref Range Status  11/21/2018 32.1 (L) 36.0 - 46.0 % Final   Hematocrit  Date Value Ref Range Status  07/01/2017 39.6 34.0 - 46.6 % Final    Physical Exam:  General: alert, cooperative and no distress Lochia: appropriate Uterine Fundus: firm Incision: healing well DVT Evaluation: No evidence of DVT seen on physical exam.  Discharge Diagnoses: Term Pregnancy-delivered  Discharge Information: Date: 11/22/2018 Activity: pelvic rest Diet: routine Medications: PNV, Ibuprofen and Tylenol Condition: stable Instructions: refer to practice specific booklet Discharge to: home Follow-up Information    Janyth Pupa, DO. Go in 6 week(s).   Specialty: Obstetrics and Gynecology Why: please call to schedule appointment postpartum visit if you do not already have one  Contact information: [address] [place] [address] [phone]           Newborn Data: Live born female  Birth Weight: 8 lb 15 oz (4054 g) APGAR: 17, 9  Newborn Delivery   Birth date/time: 11/20/2020 22:32:00 Delivery type: Vaginal, Spontaneous      Home with mother.  Pleas Koch Alanmichael Barmore 11/22/2018, 10:50 AM

## 2018-11-22 NOTE — Lactation Note (Signed)
This note was copied from a baby's chart. Lactation Consultation Note  Patient Name: Mckenzie Reeves Today's Date: 11/22/2018  P1, 68 hour female infant, [place] entered room Mom and infant asleep.    Maternal Data    Feeding Feeding Type: Bottle Fed - Formula  LATCH Score                   Interventions    Lactation Tools Discussed/Used     Consult Status      Vicente Serene 11/22/2018, 5:52 AM

## 2018-11-22 NOTE — Lactation Note (Signed)
This note was copied from a baby's chart. Lactation Consultation Note  Patient Name: Mckenzie Reeves [id number] Date: 11/22/2018 Reason for consult: Follow-up assessment;Mother's request;Nipple pain/trauma   P1, Baby 66 hours old and mother called for assistance w/latching. Mother has had pain with initial latch. She is alternating w/ comfort gels and shells with personal nipple cream. Assisted with latching in cross cradle and then mother changed to cradle. Discussed latch depth and not pulling tissue away from breast. Provided education. Feed on demand approximately 8-12 times per day.   Reviewed engorgement care and monitoring voids/stools. Mother wanted to try NS.  #24NS was applied but mother stated it did not improve initial latch pain. Discussed the need to pump if using NS. Reviewed engorgement care and monitoring voids/stools.    Maternal Data Has patient been taught Hand Expression?: Yes Does the patient have breastfeeding experience prior to this delivery?: No  Feeding Feeding Type: Breast Fed Nipple Type: Slow - flow  LATCH Score Latch: Grasps breast easily, tongue down, lips flanged, rhythmical sucking.  Audible Swallowing: Spontaneous and intermittent  Type of Nipple: Everted at rest and after stimulation  Comfort (Breast/Nipple): Engorged, cracked, bleeding, large blisters, severe discomfort  Hold (Positioning): Assistance needed to correctly position infant at breast and maintain latch.  LATCH Score: 7  Interventions Interventions: Breast feeding basics reviewed;Hand express;Breast compression;Adjust position;Position options;Support pillows;Shells;Comfort gels  Lactation Tools Discussed/Used     Consult Status Consult Status: Complete    Carlye Grippe 11/22/2018, 12:18 PM

## 2018-11-25 ENCOUNTER — Inpatient Hospital Stay (HOSPITAL_COMMUNITY): Payer: Managed Care, Other (non HMO)

## 2018-11-25 ENCOUNTER — Inpatient Hospital Stay (HOSPITAL_COMMUNITY)
Admission: AD | Admit: 2018-11-25 | Payer: Managed Care, Other (non HMO) | Source: Home / Self Care | Admitting: Obstetrics and Gynecology

## 2018-12-26 ENCOUNTER — Telehealth (HOSPITAL_COMMUNITY): Payer: Self-pay | Admitting: Lactation Services

## 2018-12-26 NOTE — Telephone Encounter (Signed)
Return telephone call - this patiemt was calling to be set up for [facility] O/P visit.  thsi LC called her back and she had breast feeding questions.  Mom explained she is breast feeding during the day and evening and the baby gets fed a bottle of EBM at night. Baby is 16 weeks old. Towards afternoon into evening the baby feeds for short time ( maybe 10 mins and comes off ) seems fussy. Per mom when the baby gets a bottle dad is PACE feeding.  LC recommended warm moist heat to the 1st breast to enhance let down, breast massage, hand express, and latch with breast compressions and a lot of massage. Also burp her before she latches to move the gas down if she has been fussy.  Offer both breast.  Mom requested and LC O/P appt for next week , important for afternoon appt Tuesday - Friday ( any of those days will work per mom ).  LC placed a request in the [facility] O/P clinic basket to call mom and mom is aware she will get a phone call from clinic.

## 2018-12-31 ENCOUNTER — Ambulatory Visit: Payer: Self-pay

## 2018-12-31 NOTE — Lactation Note (Signed)
This note was copied from a baby's chart. Lactation Consultation Note  Patient Name: Mckenzie Reeves GYIRS'W Date: 12/31/2018     12/31/2018  Name: [facility] MRN: [id number] Date of Birth: 11/20/2020 Gestational Age: Gestational Age: [redacted]w[redacted]d Birth Weight: 143 oz Weight today:    10 pounds 8.2 ounces (4768 grams) with clean cloth diaper  47 week old infant presents today with mom and MGM for feeding assessment.   Infant gained 913 grams in the last 39 days with an average daily weight gain of 23 grams a day.   Mom reports infant was BF for the first 2 weeks of feeding and then mom introduced to bottles at night and BF during the day. Mom then noted infant with difficulty with pulling on and off the breast after getting so many bottles. 3 days ago mom changed infant back to the breast full time. Mom reports infant chokes sometimes on the breast. Infant sometimes chokes on the bottle. Mom using Dr Saul Fordyce Level 1 nipple. Mom reports infant guzzles the bottle but has improved since trying paced bottle method.   Infant with thick labial frenulum to the upper lip that inserts at the bottom of the gum ridge. Infant with high palate. Infant with weaker suckle on gloved finger. Infant with good tongue extension and lateralization. Infant with some decreased mid tongue elevation. Infant with tongue thrusting and tongue disorganization. Infant will cry and scream when on the breast about 1/2 way throughout the feeding. Infant arching and some spitting noted. Mom informed of tongue and lip restrictions and how it can effect milk supply and milk transfer. Mom given website information and local providers. Will reassess at next appt.   Infant is bottle feeding with Dr. Saul Fordyce Level 1 nipple. Mom reports infant drools on the bottle, enc her to try the Preemie nipple. Infant chokes on the bottle and the breast at times. Infant with some jaw quivering with feedings. Mom reports infant overwhelmed with  letdowns and leans back to feed infant.   Infant latched to the breast and was noted to pull off of the breast about 1/2 way through feeding. She was fussy and crying and pulling off and arching. She did eventually settle and relatched again.   Infant with milk coating on the tongue, does not look like Thrush and none noted in cheeks or lips. Pt was seen by Ped yesterday.   Infant to follow up with Dr. Cindi Carbon at [facility] in [place] in August for 2 month check up. Infant to follow up with Lactation in 2 weeks.       General Information: Mother's reason for visit: Feeding assessment, infant pulling on and off the breast Consult: Initial Lactation consultant: Nonah Mattes RN,IBCLC Breastfeeding experience: eating every 3 hours during the day and 3-4 hours at night   Maternal medications: Pre-natal vitamin, Iron  Breastfeeding History: Frequency of breast feeding: every 3-4 hours Duration of feeding: 8-20 minutes  Supplementation: Supplement method: bottle(Dr. Brown's Wide based Level 1 nipple)         Breast milk volume: 3 ounces Breast milk frequency: every 2-4 hours   Pump type: Medela pump in style Pump frequency: occasionally now Pump volume: 5 ounces in the morning  Infant Output Assessment: Voids per 24 hours: 8 Urine color: Clear yellow Stools per 24 hours: once a day or every other day- very large Stool color: Yellow  Breast Assessment: Breast: Soft, Compressible Nipple: Erect Pain level: 1(with initial latch) Pain interventions: Bra, Breast  pump, Coconut oil  Feeding Assessment: Infant oral assessment: Variance Infant oral assessment comment: see notes Positioning: Cross cradle(left breast, 10 minutes) Latch: 1 - Repeated attempts needed to sustain latch, nipple held in mouth throughout feeding, stimulation needed to elicit sucking reflex. Audible swallowing: 2 - Spontaneous and intermittent Type of nipple: 2 - Everted at rest and after stimulation Comfort:  1 - Filling, red/small blisters or bruises, mild/mod discomfort Hold: 2 - No assistance needed to correctly position infant at breast LATCH score: 8 Latch assessment: Deep Lips flanged: Yes Suck assessment: Displays both   Pre-feed weight: 4768 grams Post feed weight: 4808 grams Amount transferred: 40 ml Amount supplemented: 0  Additional Feeding Assessment: Infant oral assessment: Variance Infant oral assessment comment: see note Positioning: Cross cradle(right breast, 15 minutes) Latch: 2 - Grasps breast easily, tongue down, lips flanged, rhythmical sucking. Audible swallowing: 2 - Spontaneous and intermittent Type of nipple: 2 - Everted at rest and after stimulation Comfort: 1 - Filling, red/small blisters or bruises, mild/mod discomfort Hold: 2 - No assistance needed to correctly position infant at breast LATCH score: 9 Latch assessment: Deep Lips flanged: Yes Suck assessment: Displays both   Pre-feed weight: 4808 grams Post feed weight: 4848 grams Amount transferred: 40 ml Amount supplemented: 0  Totals: Total amount transferred: 80 ml Total supplement given: 0 Total amount pumped post feed: did not pump   Plan:  1. Offer infant the breast with feeding cues as mom and infant want 2. Keep infant awake with feedings as needed  3. Offer infant both breasts with each feeding 4. Empty one breast before offering the second breast 5. Continue to offer the bottle as mom and infant want 6. Use the paced bottle feeding for offering bottles as you have been 7. Try the Dr. Theora GianottiBrown's Preemie nipple since infant choking and drooling on the bottle 8. Infant needs about 88-118 (3-4 ounces) for 8 feedings a day or 705-940 ml (24-32 ounces) in 24 hours. Infant may take more or less depending on how often she feeds. Feed infant until she is satisfied.  9. Would recommend anytime you give a bottle that you pump to promote and protect your milk supply. Pump for about 20 minutes with your  double electric breast pump.  10. Keep up the good work 11. Thank you for allowing me to assist you today 12. Please call with any questions/concerns as needed [phone] 13. Follow up with Lactation in 2 weeks   Ed BlalockSharon S Mikell Camp RN, IBCLC                                                    Silas FloodSharon S Brannon Decaire 12/31/2018, 1:32 PM

## 2019-01-14 ENCOUNTER — Ambulatory Visit: Payer: Self-pay

## 2019-01-14 NOTE — Lactation Note (Signed)
This note was copied from a baby's chart. Lactation Consultation Note  Patient Name: Mckenzie Reeves GGYIR'S Date: 01/14/2019     01/14/2019  Name: [facility] MRN: [id number] Date of Birth: 11/20/2020 Gestational Age: Gestational Age: [redacted]w[redacted]d Birth Weight: 143 oz Weight today:    10 pounds 15.6 ounces (4978 grams) with clean cloth diaper  71  week old infant presents today with mom for follow up feeding assessment.   Infant has gained 210 grams in the last 14 days with an average daily weight gain of 15 grams a day. Weight gain has decreased since last visit.    Infant is feeding on demand. She typically feeds about every 1-3 hours for about 10-20 minutes per side. Infant sometimes eat shorter or longer with feedings. Mom is pumping and bottle feeding at night. Infant paced feeding with preemie nipple. Pt changed to the Dr. Theora Gianotti Preemie nipple, Infant is still choking on the preemie nipple but has less drooling. Mom is awakening infant at night as infant wants to sleep longer at night, enc her to continue to do so as infant weight gain rate has decreased in last 2 weeks.   Mom reports infant is distracted during the day and does better when mom covers her. Mom reports infant is fatigued as the day goes on and does not eat as well in the evening. Mom feels infant is feeding more often now and mom feels like she struggles with feeding. Infant with jaw quivering and disorganization with feedings ( breast and bottle feeding). Infant is sleepy on the bottle feedings at night also.   Mom reports infant is still pulling off and crying with feedings at times. She tends to do when she is more fatigued.   Mom is pumping at night when infant is getting a bottle at night. Mom obtains about 3.5-5 ounces per pumping. Mom has milk stored in the fridge and freezer.   Infant with thick labial frenulum to the upper lip that inserts at the bottom of the gum ridge. Infant with high palate. Infant with  weaker suckle on gloved finger. Infant with good tongue extension and lateralization. Infant with some decreased mid tongue elevation. Infant with tongue thrusting and tongue disorganization on the pacifier, breast, and the bottle. Mom feels infant is fatigued with feedings. Infant will cry and scream when on the breast about 1/2 way throughout the feeding. Infant arching and some spitting noted, not every day and milk is curdled and milk colored. Infant not able to hold pacifier in her mouth. Reviewed  tongue and lip restrictions with mom and how it can effect milk supply and milk transfer. Mom has researched and feels like she would like to have infant evaluated. Mom has contact information for Providers to assess infant.    Infant with thick coating to back of tongue, can wipe off although difficult to get to. No patches noted in cheeks or lips.   Infant fed on both breasts. She is very sleepy at the breast and falls asleep easier. Mom massages breast with feeding as needed.   Infant to follow up with Dr. Georgeanne Nim on Sept 2. Pt to follow up with Lactation as needed or 1-5 days post tongue and lip releases if completed.      General Information: Mother's reason for visit: Follow up Feeding assessment Consult: Follow-up Lactation consultant: Noralee Stain RN,IBCLC Breastfeeding experience: BF during the day on demand and bottle feeding at night   Maternal medications: Pre-natal vitamin, Iron  Breastfeeding History: Frequency of breast feeding: every 1-2 hours during the day with an occasional 3 hour stretch Duration of feeding: 15-20 minutes  Supplementation: Supplement method: bottle(Dr. Brown's Preemie Nipple)         Breast milk volume: 3.5 ounces Breast milk frequency: 3-4 Total breast milk volume per day: 10.5-14 ounces Pump type: Medela pump in style Pump frequency: 3-4 x a day Pump volume: 3.5-5 ounces  Infant Output Assessment: Voids per 24 hours: 10 Urine color: Clear  yellow Stools per 24 hours: 10 Stool color: Yellow  Breast Assessment: Breast: Soft, Compressible Nipple: Erect Pain level: 2 Pain interventions: Bra, Coconut oil, Breast pump  Feeding Assessment: Infant oral assessment: Variance Infant oral assessment comment: see note Positioning: Cradle(left breast, 21 minutes) Latch: 1 - Repeated attempts needed to sustain latch, nipple held in mouth throughout feeding, stimulation needed to elicit sucking reflex. Audible swallowing: 2 - Spontaneous and intermittent Type of nipple: 2 - Everted at rest and after stimulation Comfort: 1 - Filling, red/small blisters or bruises, mild/mod discomfort Hold: 2 - No assistance needed to correctly position infant at breast LATCH score: 8 Latch assessment: Deep Lips flanged: Yes Suck assessment: Displays both   Pre-feed weight: 4978 grams Post feed weight: 5012 grams Amount transferred: 34 ml Amount supplemented: 0  Additional Feeding Assessment: Infant oral assessment: Variance Infant oral assessment comment: see note Positioning: Cross cradle(right breast, 22 minutes) Latch: 1 - Repeated attempts neede to sustain latch, nipple held in mouth throughout feeding, stimulation needed to elicit sucking reflex. Audible swallowing: 2 - Spontaneous and intermittent Type of nipple: 2 - Everted at rest and after stimulation Comfort: 1 - Filling, red/small blisters or bruises, mild/mod discomfort Hold: 2 - No assistance needed to correctly position infant at breast LATCH score: 8 Latch assessment: Deep Lips flanged: Yes Suck assessment: Displays both   Pre-feed weight: 5012 grams Post feed weight: 5058 grams Amount transferred: 46 ml Amount supplemented: 0  Totals: Total amount transferred: 80 ml Total supplement given: 0 Total amount pumped post feed: did not pump  Plan:  1. Offer infant the breast with feeding cues as mom and infant want 2. Keep infant awake with feedings as needed  3. Offer  infant both breasts with each feeding 4. Empty one breast before offering the second breast 5. Continue to offer the bottle as mom and infant want. Offer infant a bottle after breast feeding with breast milk or formula if she is still cueing to feed.  6. Use the paced bottle feeding for offering bottles as you have been 7. Try the Dr. Theora GianottiBrown's Preemie nipple since infant choking and drooling on the bottle 8. Infant needs about 92-123 (3-4 ounces) for 8 feedings a day or 735-980 ml (25-33 ounces) in 24 hours. Infant may take more or less depending on how often she feeds. Feed infant until she is satisfied.  9. Would recommend anytime you give a bottle that you pump to promote and protect your milk supply. Pump for about 20 minutes with your double electric breast pump.  10. Would recommend that you have infant evaluated by oral specialist 11. Keep up the good work 12. Thank you for allowing me to assist you today 13. Please call with any questions/concerns as needed [phone] 14. Follow up with Lactation as needed or 1-5 days post tongue and lip releases if completed    Ed BlalockSharon S Talan Gildner RN, [employer] CorporationBCLC  Debby Freiberg Bion Todorov 01/14/2019, 1:10 PM

## 2019-02-26 ENCOUNTER — Ambulatory Visit: Payer: Self-pay

## 2019-02-26 NOTE — Lactation Note (Signed)
This note was copied from a baby's chart. Lactation Consultation Note  Patient Name: Mckenzie Reeves IZTIW'P Date: 02/26/2019     02/26/2019  Name: Mckenzie Reeves MRN: [id number] Date of Birth: 11/20/2020 Gestational Age: Gestational Age: [redacted]w[redacted]d Birth Weight: 143 oz Weight today:    12 pounds 6 ounces (5612 grams) with clean cloth diaper    37 month old presents today with mom for follow up feeding assessment. Infant post tongue and lip releases on Sept 24 by Dr. Verdene Lennert.   Infant has gained 634 grams in the last 42 days with an average daily weight gain of 15 grams a day.   Mom reports infant is BF more since her releases. Mom reports infant is more relaxed at the breast and seems more efficient. Mom feels like infant gets more volume at a feeding. Infant is still inconsistent with feeding quality.   Infant feeds about every 1.5-3 hours with the breast or the bottle. She is nursing more than getting the bottle.   Mom is pumping with each feeding either at the breast or with the bottle. She is getting 1-2 ounces and 3.5-5 ounces when not BF.   Infant is taking bottles and taking 3.5-5 ounces per feeding. Infant is still using the Dr. Saul Fordyce Preemie nipple and Playtex nipples. Infant is tolerating well per mom. She is not drooling on the bottle and does not choke if pace fed.   Infant fed well on the breast. She is noted to be biting at the end of the feeding. Infant with some decreased seal on the finger today. Infant lip and tongue are healing well with tongue and lip mobility.   Mom is performing stretches per Dr. Verdene Lennert. Mom reports infant is not reacting to stretches now. Mom shown suck training and given handout. Enc mom to try to the best of her ability.   Infant to follow up with Dr. Cindi Carbon at 4 months. Infant to follow up with Lactation as needed.     General Information: Mother's reason for visit: Follow up feeding assessment post tongue and lip releases on 9/24 by Dr.  Joella Prince Consult: Follow-up Lactation consultant: Nonah Mattes RN,IBCLC Breastfeeding experience: BF has improved since releases   Maternal medications: Pre-natal vitamin, Iron  Breastfeeding History: Frequency of breast feeding: 3 x , both breasts Duration of feeding: 15+  Supplementation: Supplement method: bottle(Playtex slow flow nipple)   Formula volume: 3.5-5 ounces Formula frequency: occasionally     Breast milk frequency: 5 x a day Total breast milk volume per day: 17+ ounces Pump type: Medela pump in style Pump frequency: 7-8 x a day Pump volume: 1-5 ounces  Infant Output Assessment: Voids per 24 hours: 8+ Urine color: Clear yellow Stools per 24 hours: 1 large volume, occasionally every other day Stool color: Yellow  Breast Assessment: Breast: Soft, Compressible Nipple: Erect Pain level: 0 Pain interventions: Bra, Breast pump, Coconut oil  Feeding Assessment: Infant oral assessment: Variance Infant oral assessment comment: see note Positioning: Cradle(right breast, 25 minutes.) Latch: 2 - Grasps breast easily, tongue down, lips flanged, rhythmical sucking. Audible swallowing: 2 - Spontaneous and intermittent Type of nipple: 2 - Everted at rest and after stimulation Comfort: 2 - Soft/non-tender Hold: 2 - No assistance needed to correctly position infant at breast LATCH score: 10 Latch assessment: Deep Lips flanged: Yes Suck assessment: Displays both   Pre-feed weight: 5612 grams Post feed weight: 5694 grams Amount transferred: 82 ml Amount supplemented: 0  Additional Feeding Assessment: Infant oral  assessment: Variance Infant oral assessment comment: see note Positioning: Cradle(left breast, 5 minutes) Latch: 1 - Repeated attempts neede to sustain latch, nipple held in mouth throughout feeding, stimulation needed to elicit sucking reflex. Audible swallowing: 2 - Spontaneous and intermittent Type of nipple: 2 - Everted at rest and after  stimulation Comfort: 1 - Filling, red/small blisters or bruises, mild/mod discomfort Hold: 2 - No assistance needed to correctly position infant at breast LATCH score: 8 Latch assessment: Deep Lips flanged: Yes Suck assessment: Displays both   Pre-feed weight: 5694 grams Post feed weight: 5700 grams Amount transferred: 6 ml Amount supplemented: 0  Totals: Total amount transferred: 88 ml Total supplement given: 0 Total amount pumped post feed: did not pump   Plan:  1. Offer infant the breast with feeding cues as mom and infant want 2. Keep infant awake with feedings as needed  3. Offer infant both breasts with each feeding 4. Empty one breast before offering the second breast 5. Continue to offer the bottle as mom and infant want. Offer infant a bottle after breast feeding with breast milk or formula if she is still cueing to feed.  6. Infant needs about 105 ml + (3.5-4 ounces) for 8 feedings a day or 840+ ml (28 + ounces) in 24 hours. Infant may take more or less depending on how often she feeds. Feed infant until she is satisfied.  7. Would recommend anytime you give a bottle that you pump to promote and protect your milk supply. Pump for about 20 minutes with your double electric breast pump.  8. Continue stretches per Dr. Orland Mustard 9. Suck training 5-6 x a day for 1-2 minutes per exercise for 2-3 weeks until suck has improved 10. Keep up the good work 11. Thank you for allowing me to assist you today 12. Please call with any questions/concerns as needed [phone] 13. Follow up with Lactation as needed     Ed Blalock RN, IBCLC                                                    Ed Blalock 02/26/2019, 3:48 PM

## 2021-03-06 ENCOUNTER — Encounter: Payer: Self-pay | Admitting: Obstetrics & Gynecology

## 2021-03-06 ENCOUNTER — Ambulatory Visit (INDEPENDENT_AMBULATORY_CARE_PROVIDER_SITE_OTHER): Payer: BC Managed Care – PPO | Admitting: Obstetrics & Gynecology

## 2021-03-06 ENCOUNTER — Other Ambulatory Visit: Payer: Self-pay

## 2021-03-06 ENCOUNTER — Other Ambulatory Visit (HOSPITAL_COMMUNITY)
Admission: RE | Admit: 2021-03-06 | Discharge: 2021-03-06 | Disposition: A | Discharge status: Home / Self Care | Payer: BC Managed Care – PPO | Source: Ambulatory Visit | Attending: Obstetrics & Gynecology | Admitting: Obstetrics & Gynecology

## 2021-03-06 VITALS — Ht 69.0 in

## 2021-03-06 DIAGNOSIS — F32A Depression, unspecified: Secondary | ICD-10-CM | POA: Diagnosis not present

## 2021-03-06 DIAGNOSIS — Z01419 Encounter for gynecological examination (general) (routine) without abnormal findings: Secondary | ICD-10-CM

## 2021-03-06 DIAGNOSIS — Z30432 Encounter for removal of intrauterine contraceptive device: Secondary | ICD-10-CM

## 2021-03-06 DIAGNOSIS — F419 Anxiety disorder, unspecified: Secondary | ICD-10-CM

## 2021-03-06 MED ORDER — SERTRALINE HCL 100 MG PO TABS: 100.0000 mg | ORAL_TABLET | Freq: Every day | ORAL | 4 refills | Status: DC

## 2021-03-06 NOTE — Progress Notes (Signed)
WELL-WOMAN EXAMINATION Patient name: Mckenzie Reeves MRN [id number]  Date of birth: 21-Dec-1995 Chief Complaint:   Annual Exam (Also wants IUD removed)  History of Present Illness:   Mckenzie Reeves is a 25 y.o. G73P1001 female being seen today for a routine well-woman exam.   Today she desires IUD removal- considering 2nd baby  Anxiety/Depression- Taking sertraline 100mg  daily- does feel like it helps, but still struggling with anxiety.  Pt became tearful in taking about it.  States that she is doing much better than where she was and overall happy.  Notes easy crying episodes. Reports struggle with breastfeeding and how she used this as a measure as to whether she was a "good mom." She understands now that this wasn't healthy.  She is hopeful that since she learned from her experience this will not happen again with a 2nd child.  Hair loss- not sure when this happened, but notes that her hair has two lengths.  Restarted vitamin D  No LMP recorded. (Menstrual status: IUD). Denies issues with her menses- no periods with IUD The current method of family planning is IUD.    Last pap 2019/2020? []  plan to obtain records.  Last mammogram: n/a. Last colonoscopy: n/a  Depression screen [facility] 2/9 03/06/2021  Decreased Interest 0  Down, Depressed, Hopeless 1  PHQ - 2 Score 1  Altered sleeping 0  Tired, decreased energy 1  Change in appetite 0  Feeling bad or failure about yourself  2  Trouble concentrating 0  Moving slowly or fidgety/restless 0  Suicidal thoughts 0  PHQ-9 Score 4      Review of Systems:   Pertinent items are noted in HPI Denies any headaches, blurred vision, fatigue, shortness of breath, chest pain, abdominal pain, bowel movements, urination, or intercourse unless otherwise stated above.  Pertinent History Reviewed:  Reviewed past medical,surgical, social and family history.  Reviewed problem list, medications and allergies. Physical Assessment:   Vitals:    03/06/21 1540  Height: 5\' 9"  (1.753 m)  Body mass index is 35.59 kg/m.        Physical Examination:   General appearance - well appearing, and in no distress  Mental status - alert, oriented to person, place, and time  Psych:  She has a normal mood and affect  Skin - warm and dry, normal color, no suspicious lesions noted  Chest - effort normal, all lung fields clear to auscultation bilaterally  Heart - normal rate and regular rhythm  Neck:  midline trachea, no thyromegaly or nodules  Breasts - breasts appear normal, no suspicious masses, no skin or nipple changes or  axillary nodes  Abdomen - soft, nontender, nondistended, no masses or organomegaly  Pelvic - VULVA: normal appearing vulva with no masses, tenderness or lesions  VAGINA: normal appearing vagina with normal color and discharge, no lesions  CERVIX: normal appearing cervix without discharge or lesions, no CMT, IUD in place  Thin prep pap is done with HR HPV cotesting  UTERUS: uterus is felt to be normal size, shape, consistency and nontender   ADNEXA: No adnexal masses or tenderness noted.  Extremities:  No swelling or varicosities noted  IUD REMOVAL   Time out was performed.  A sterile speculum was placed in the vagina.  The cervix was visualized, and the strings visible. They were grasped and the IUD was easily removed intact without complications. The patient tolerated the procedure well.   Chaperone: 03/08/2021     Assessment & Plan:  1) Well-Woman Exam Pap collected, reviewed guidelines  2) Family planning- IUD removed without difficulty Continue PNV daily  3) Anxiety/Depression- continue sertraline 100mg  daily  4) Hair loss- discussed that IUD can also be contributing, continue vitamins and will continue to monitor   Meds: No orders of the defined types were placed in this encounter.   Follow-up: Return in about 6 months (around 09/04/2021) for follow up- Dr. 09/06/2021.   Charlotta Newton, DO Attending  Obstetrician & Gynecologist, [facility] for [facility]

## 2021-03-08 LAB — CYTOLOGY - PAP
Chlamydia: NEGATIVE
Comment: NEGATIVE
Comment: NEGATIVE
Comment: NORMAL
Diagnosis: NEGATIVE
High risk HPV: NEGATIVE
Neisseria Gonorrhea: NEGATIVE

## 2021-05-21 NOTE — L&D Delivery Note (Signed)
Delivery Note  Pt began to feel uncomfortable pressure and was found to be completely dilated.  She pushed very well for about 10 minutes and at 5:05 PM a healthy female was delivered via Vaginal, Spontaneous (Presentation:   Occiput Anterior).  APGAR: 8, 9; weight 7 lb 13.2 oz (3550 g).   Placenta status: Spontaneous;Expressed, Intact.  Cord: 3 vessels with the following complications: Nuchal x 1 reduced. Anesthesia: Epidural Episiotomy: None Lacerations: 1st degree;Periurethral Suture Repair: 3.0 vicryl rapide Est. Blood Loss (mL): 238  Mom to postpartum.  Baby to Couplet care / Skin to Skin. D/w pt circumcision and they desire to proceed prior to discharge.  Oliver Pila 01/15/2022, 5:37 PM

## 2021-07-10 LAB — HEPATITIS C ANTIBODY: HCV Ab: NEGATIVE

## 2021-07-10 LAB — OB RESULTS CONSOLE RPR: RPR: NONREACTIVE

## 2021-07-10 LAB — OB RESULTS CONSOLE HIV ANTIBODY (ROUTINE TESTING): HIV: NONREACTIVE

## 2021-07-10 LAB — OB RESULTS CONSOLE GC/CHLAMYDIA
Chlamydia: NEGATIVE
Neisseria Gonorrhea: NEGATIVE

## 2021-07-10 LAB — OB RESULTS CONSOLE HEPATITIS B SURFACE ANTIGEN: Hepatitis B Surface Ag: NEGATIVE

## 2021-07-10 LAB — OB RESULTS CONSOLE RUBELLA ANTIBODY, IGM: Rubella: IMMUNE

## 2021-11-10 ENCOUNTER — Other Ambulatory Visit: Payer: Self-pay | Admitting: Obstetrics & Gynecology

## 2021-11-10 DIAGNOSIS — F419 Anxiety disorder, unspecified: Secondary | ICD-10-CM

## 2021-11-10 DIAGNOSIS — F32A Depression, unspecified: Secondary | ICD-10-CM

## 2021-11-14 MED ORDER — SERTRALINE HCL 100 MG PO TABS: 100.0000 mg | ORAL_TABLET | Freq: Every day | ORAL | 4 refills | Status: AC

## 2021-12-29 LAB — OB RESULTS CONSOLE GBS: GBS: NEGATIVE

## 2022-01-14 ENCOUNTER — Other Ambulatory Visit: Payer: Self-pay | Admitting: Obstetrics and Gynecology

## 2022-01-14 DIAGNOSIS — Z3490 Encounter for supervision of normal pregnancy, unspecified, unspecified trimester: Secondary | ICD-10-CM

## 2022-01-14 NOTE — H&P (View-Only) (Signed)
Mckenzie Reeves is a 26 y.o. female G2P1001 at 25 0/7 weeks (EDD 01/22/22 by LMP c/w 11 week [place]) presenting for elective IOL.  Pt has had a relatively uncomplicated pregnancy.  She has mild depression, stable on zoloft and migraines treated with prn fioricet.The baby was breech at 36 weeks but spontaneously turned to vertex by 37 weeks and has remained in that position.  Past OBHx 11-20-2018, 39 wks F, 8lbs 15oz, Vaginal Delivery  Past Medical History:  Diagnosis Date   Anemia    with pregnancy   Anxiety    PONV (postoperative nausea and vomiting)    Past Surgical History:  Procedure Laterality Date   TONSILLECTOMY     Family History: family history includes Breast cancer in her maternal grandmother; Diabetes in her father and paternal grandfather; Ovarian cysts in her mother and sister. Social History:  reports that she has never smoked. She has never used smokeless tobacco. She reports current alcohol use. She reports that she does not use drugs.     Maternal Diabetes: No Genetic Screening: Normal Maternal Ultrasounds/Referrals: Normal Fetal Ultrasounds or other Referrals:  None Maternal Substance Abuse:  No Significant Maternal Medications:  Meds include: Zoloft Significant Maternal Lab Results:  Group B Strep negative Number of Prenatal Visits:greater than 3 verified prenatal visits Other Comments:  None  Review of Systems  Constitutional:  Negative for fever.  Gastrointestinal:  Negative for abdominal pain.  Genitourinary:  Negative for vaginal bleeding.   Maternal Medical History:  Contractions: Frequency: irregular.   Perceived severity is mild.   Fetal activity: Perceived fetal activity is normal.   Prenatal Complications - Diabetes: none.     There were no vitals taken for this visit. Maternal Exam:  Uterine Assessment: Contraction strength is mild.  Contraction frequency is irregular.  Abdomen: Patient reports no abdominal tenderness. Fetal presentation:  vertex Introitus: Normal vulva.   Physical Exam Constitutional:      Appearance: Normal appearance.  Cardiovascular:     Rate and Rhythm: Normal rate and regular rhythm.  Pulmonary:     Effort: Pulmonary effort is normal.  Abdominal:     Palpations: Abdomen is soft.  Genitourinary:    General: Normal vulva.  Neurological:     Mental Status: She is alert.     Prenatal labs: ABO, Rh:  B positive Antibody:  Negative Rubella:  Immune RPR:   NR HBsAg:   Neg HIV:   NR GBS:   Neg NIPT low risk Hgb AA One hour GCT 149 Three hour GTT WNL   Assessment/Plan: Pt admitted for IOL at term.  Plan pitocin and AROM when more active   Oliver Pila 01/14/2022, 7:55 AM

## 2022-01-14 NOTE — H&P (Signed)
Mckenzie Reeves is a 26 y.o. female G2P1001 at 39 0/7 weeks (EDD 01/22/22 by LMP c/w 11 week [place]) presenting for elective IOL.  Pt has had a relatively uncomplicated pregnancy.  She has mild depression, stable on zoloft and migraines treated with prn fioricet.The baby was breech at 36 weeks but spontaneously turned to vertex by 37 weeks and has remained in that position.  Past OBHx 11-20-2018, 39 wks F, 8lbs 15oz, Vaginal Delivery  Past Medical History:  Diagnosis Date   Anemia    with pregnancy   Anxiety    PONV (postoperative nausea and vomiting)    Past Surgical History:  Procedure Laterality Date   TONSILLECTOMY     Family History: family history includes Breast cancer in her maternal grandmother; Diabetes in her father and paternal grandfather; Ovarian cysts in her mother and sister. Social History:  reports that she has never smoked. She has never used smokeless tobacco. She reports current alcohol use. She reports that she does not use drugs.     Maternal Diabetes: No Genetic Screening: Normal Maternal Ultrasounds/Referrals: Normal Fetal Ultrasounds or other Referrals:  None Maternal Substance Abuse:  No Significant Maternal Medications:  Meds include: Zoloft Significant Maternal Lab Results:  Group B Strep negative Number of Prenatal Visits:greater than 3 verified prenatal visits Other Comments:  None  Review of Systems  Constitutional:  Negative for fever.  Gastrointestinal:  Negative for abdominal pain.  Genitourinary:  Negative for vaginal bleeding.   Maternal Medical History:  Contractions: Frequency: irregular.   Perceived severity is mild.   Fetal activity: Perceived fetal activity is normal.   Prenatal Complications - Diabetes: none.     There were no vitals taken for this visit. Maternal Exam:  Uterine Assessment: Contraction strength is mild.  Contraction frequency is irregular.  Abdomen: Patient reports no abdominal tenderness. Fetal presentation:  vertex Introitus: Normal vulva.   Physical Exam Constitutional:      Appearance: Normal appearance.  Cardiovascular:     Rate and Rhythm: Normal rate and regular rhythm.  Pulmonary:     Effort: Pulmonary effort is normal.  Abdominal:     Palpations: Abdomen is soft.  Genitourinary:    General: Normal vulva.  Neurological:     Mental Status: She is alert.     Prenatal labs: ABO, Rh:  B positive Antibody:  Negative Rubella:  Immune RPR:   NR HBsAg:   Neg HIV:   NR GBS:   Neg NIPT low risk Hgb AA One hour GCT 149 Three hour GTT WNL   Assessment/Plan: Pt admitted for IOL at term.  Plan pitocin and AROM when more active   Mckenzie Reeves 01/14/2022, 7:55 AM     

## 2022-01-15 ENCOUNTER — Encounter (HOSPITAL_COMMUNITY): Payer: Self-pay | Admitting: Obstetrics and Gynecology

## 2022-01-15 ENCOUNTER — Other Ambulatory Visit: Payer: Self-pay

## 2022-01-15 ENCOUNTER — Inpatient Hospital Stay (HOSPITAL_COMMUNITY)
Admission: AD | Admit: 2022-01-15 | Discharge: 2022-01-16 | DRG: 807 | Disposition: A | Discharge status: Home / Self Care | Payer: BC Managed Care – PPO | Attending: Obstetrics and Gynecology | Admitting: Obstetrics and Gynecology

## 2022-01-15 ENCOUNTER — Inpatient Hospital Stay (HOSPITAL_COMMUNITY): Payer: BC Managed Care – PPO

## 2022-01-15 ENCOUNTER — Inpatient Hospital Stay (HOSPITAL_COMMUNITY): Payer: BC Managed Care – PPO | Admitting: Anesthesiology

## 2022-01-15 DIAGNOSIS — F32A Depression, unspecified: Secondary | ICD-10-CM | POA: Diagnosis present

## 2022-01-15 DIAGNOSIS — Z3A39 39 weeks gestation of pregnancy: Secondary | ICD-10-CM | POA: Diagnosis not present

## 2022-01-15 DIAGNOSIS — Z3490 Encounter for supervision of normal pregnancy, unspecified, unspecified trimester: Secondary | ICD-10-CM

## 2022-01-15 DIAGNOSIS — O26893 Other specified pregnancy related conditions, third trimester: Secondary | ICD-10-CM | POA: Diagnosis present

## 2022-01-15 DIAGNOSIS — O99344 Other mental disorders complicating childbirth: Secondary | ICD-10-CM | POA: Diagnosis present

## 2022-01-15 LAB — CBC
HCT: 36.2 % (ref 36.0–46.0)
Hemoglobin: 12.1 g/dL (ref 12.0–15.0)
MCH: 31 pg (ref 26.0–34.0)
MCHC: 33.4 g/dL (ref 30.0–36.0)
MCV: 92.8 fL (ref 80.0–100.0)
Platelets: 168 10*3/uL (ref 150–400)
RBC: 3.9 MIL/uL (ref 3.87–5.11)
RDW: 13.2 % (ref 11.5–15.5)
WBC: 7.3 10*3/uL (ref 4.0–10.5)
nRBC: 0 % (ref 0.0–0.2)

## 2022-01-15 LAB — TYPE AND SCREEN
ABO/RH(D): B POS
Antibody Screen: NEGATIVE

## 2022-01-15 LAB — RPR: RPR Ser Ql: NONREACTIVE

## 2022-01-15 MED ORDER — ONDANSETRON HCL 4 MG PO TABS: 4.0000 mg | ORAL_TABLET | ORAL | Status: DC | PRN

## 2022-01-15 MED ORDER — FENTANYL-BUPIVACAINE-NACL 0.5-0.125-0.9 MG/250ML-% EP SOLN
12.0000 mL/h | EPIDURAL | Status: DC | PRN
  Filled 2022-01-15: qty 250

## 2022-01-15 MED ORDER — OXYCODONE-ACETAMINOPHEN 5-325 MG PO TABS: 2.0000 | ORAL_TABLET | ORAL | Status: DC | PRN

## 2022-01-15 MED ORDER — LIDOCAINE HCL (PF) 1 % IJ SOLN: 30.0000 mL | INTRAMUSCULAR | Status: DC | PRN

## 2022-01-15 MED ORDER — ACETAMINOPHEN 325 MG PO TABS
650.0000 mg | ORAL_TABLET | ORAL | Status: DC | PRN
Start: 2022-01-15 — End: 2022-01-15

## 2022-01-15 MED ORDER — LACTATED RINGERS IV SOLN
500.0000 mL | Freq: Once | INTRAVENOUS | Status: AC
  Administered 2022-01-15: 500 mL via INTRAVENOUS

## 2022-01-15 MED ORDER — WITCH HAZEL-GLYCERIN EX PADS
1.0000 | MEDICATED_PAD | CUTANEOUS | Status: DC | PRN
Start: 2022-01-15 — End: 2022-01-17

## 2022-01-15 MED ORDER — FENTANYL CITRATE (PF) 100 MCG/2ML IJ SOLN: 50.0000 ug | INTRAMUSCULAR | Status: DC | PRN

## 2022-01-15 MED ORDER — EPHEDRINE 5 MG/ML INJ
10.0000 mg | INTRAVENOUS | Status: DC | PRN
  Filled 2022-01-15: qty 5

## 2022-01-15 MED ORDER — PRENATAL MULTIVITAMIN CH
1.0000 | ORAL_TABLET | Freq: Every day | ORAL | Status: DC
  Administered 2022-01-16: 1 via ORAL
  Filled 2022-01-15: qty 1

## 2022-01-15 MED ORDER — EPHEDRINE 5 MG/ML INJ: 10.0000 mg | INTRAVENOUS | Status: DC | PRN

## 2022-01-15 MED ORDER — PHENYLEPHRINE 80 MCG/ML (10ML) SYRINGE FOR IV PUSH (FOR BLOOD PRESSURE SUPPORT)
80.0000 ug | PREFILLED_SYRINGE | INTRAVENOUS | Status: DC | PRN
  Administered 2022-01-15: 80 ug via INTRAVENOUS
  Filled 2022-01-15: qty 10

## 2022-01-15 MED ORDER — PHENYLEPHRINE 80 MCG/ML (10ML) SYRINGE FOR IV PUSH (FOR BLOOD PRESSURE SUPPORT)
80.0000 ug | PREFILLED_SYRINGE | INTRAVENOUS | Status: DC | PRN
  Administered 2022-01-15: 80 ug via INTRAVENOUS

## 2022-01-15 MED ORDER — DIBUCAINE (PERIANAL) 1 % EX OINT: 1.0000 | TOPICAL_OINTMENT | CUTANEOUS | Status: DC | PRN

## 2022-01-15 MED ORDER — SERTRALINE HCL 100 MG PO TABS
100.0000 mg | ORAL_TABLET | Freq: Every day | ORAL | Status: DC
  Administered 2022-01-15 – 2022-01-16 (×2): 100 mg via ORAL
  Filled 2022-01-15 (×2): qty 1

## 2022-01-15 MED ORDER — ONDANSETRON HCL 4 MG/2ML IJ SOLN: 4.0000 mg | Freq: Four times a day (QID) | INTRAMUSCULAR | Status: DC | PRN

## 2022-01-15 MED ORDER — TETANUS-DIPHTH-ACELL PERTUSSIS 5-2.5-18.5 LF-MCG/0.5 IM SUSY: 0.5000 mL | PREFILLED_SYRINGE | Freq: Once | INTRAMUSCULAR | Status: DC

## 2022-01-15 MED ORDER — SENNOSIDES-DOCUSATE SODIUM 8.6-50 MG PO TABS
2.0000 | ORAL_TABLET | ORAL | Status: DC
  Administered 2022-01-15: 2 via ORAL
  Filled 2022-01-15: qty 2

## 2022-01-15 MED ORDER — ONDANSETRON HCL 4 MG/2ML IJ SOLN: 4.0000 mg | INTRAMUSCULAR | Status: DC | PRN

## 2022-01-15 MED ORDER — COCONUT OIL OIL
1.0000 | TOPICAL_OIL | Status: DC | PRN
Start: 2022-01-15 — End: 2022-01-17

## 2022-01-15 MED ORDER — TERBUTALINE SULFATE 1 MG/ML IJ SOLN: 0.2500 mg | Freq: Once | INTRAMUSCULAR | Status: DC | PRN

## 2022-01-15 MED ORDER — OXYCODONE-ACETAMINOPHEN 5-325 MG PO TABS: 1.0000 | ORAL_TABLET | ORAL | Status: DC | PRN

## 2022-01-15 MED ORDER — OXYTOCIN BOLUS FROM INFUSION
333.0000 mL | Freq: Once | INTRAVENOUS | Status: AC
  Administered 2022-01-15: 333 mL via INTRAVENOUS

## 2022-01-15 MED ORDER — OXYTOCIN-SODIUM CHLORIDE 30-0.9 UT/500ML-% IV SOLN
1.0000 m[IU]/min | INTRAVENOUS | Status: DC
  Administered 2022-01-15: 2 m[IU]/min via INTRAVENOUS
  Filled 2022-01-15: qty 500

## 2022-01-15 MED ORDER — SIMETHICONE 80 MG PO CHEW
80.0000 mg | CHEWABLE_TABLET | ORAL | Status: DC | PRN
  Administered 2022-01-16: 80 mg via ORAL
  Filled 2022-01-15: qty 1

## 2022-01-15 MED ORDER — BENZOCAINE-MENTHOL 20-0.5 % EX AERO
1.0000 | INHALATION_SPRAY | CUTANEOUS | Status: DC | PRN
  Administered 2022-01-16: 1 via TOPICAL
  Filled 2022-01-15: qty 56

## 2022-01-15 MED ORDER — LIDOCAINE HCL (PF) 1 % IJ SOLN
INTRAMUSCULAR | Status: DC | PRN
  Administered 2022-01-15 (×2): 4 mL via EPIDURAL

## 2022-01-15 MED ORDER — OXYTOCIN-SODIUM CHLORIDE 30-0.9 UT/500ML-% IV SOLN: 2.5000 [IU]/h | INTRAVENOUS | Status: DC

## 2022-01-15 MED ORDER — ACETAMINOPHEN 325 MG PO TABS
650.0000 mg | ORAL_TABLET | ORAL | Status: DC | PRN
  Administered 2022-01-16: 650 mg via ORAL
  Filled 2022-01-15: qty 2

## 2022-01-15 MED ORDER — DIPHENHYDRAMINE HCL 50 MG/ML IJ SOLN: 12.5000 mg | INTRAMUSCULAR | Status: DC | PRN

## 2022-01-15 MED ORDER — ZOLPIDEM TARTRATE 5 MG PO TABS: 5.0000 mg | ORAL_TABLET | Freq: Every evening | ORAL | Status: DC | PRN

## 2022-01-15 MED ORDER — FENTANYL-BUPIVACAINE-NACL 0.5-0.125-0.9 MG/250ML-% EP SOLN
EPIDURAL | Status: DC | PRN
  Administered 2022-01-15: 12 mL/h via EPIDURAL

## 2022-01-15 MED ORDER — DIPHENHYDRAMINE HCL 25 MG PO CAPS: 25.0000 mg | ORAL_CAPSULE | Freq: Four times a day (QID) | ORAL | Status: DC | PRN

## 2022-01-15 MED ORDER — IBUPROFEN 600 MG PO TABS
600.0000 mg | ORAL_TABLET | Freq: Four times a day (QID) | ORAL | Status: DC
Start: 2022-01-15 — End: 2022-01-17
  Administered 2022-01-15 – 2022-01-16 (×4): 600 mg via ORAL
  Filled 2022-01-15 (×5): qty 1

## 2022-01-15 MED ORDER — SOD CITRATE-CITRIC ACID 500-334 MG/5ML PO SOLN: 30.0000 mL | ORAL | Status: DC | PRN

## 2022-01-15 MED ORDER — LACTATED RINGERS IV SOLN: INTRAVENOUS | Status: DC

## 2022-01-15 MED ORDER — LACTATED RINGERS IV SOLN
500.0000 mL | INTRAVENOUS | Status: DC | PRN
  Administered 2022-01-15: 500 mL via INTRAVENOUS

## 2022-01-15 NOTE — Interval H&P Note (Signed)
History and Physical Interval Note: Pt admitted and started on pitocin.  Feeling mild contractions Afeb VSS FHR category 1 Cervix 40/2/-2 AROM clear Continue pitocin Epidural prn   Oliver Pila

## 2022-01-15 NOTE — Progress Notes (Addendum)
Patient ID: Mckenzie Reeves, female   DOB: 1996/05/20, 26 y.o.   MRN: [id number] Pt received epidural and comfortable  Afeb VSS FHR baseline 115-120 with accels and +variability, occasional mild decels  Cervix 80/3+/-1  IUPC placed as unable to trace contractions well Pitocin at 66mu, will titrate as needed

## 2022-01-15 NOTE — Anesthesia Procedure Notes (Signed)
Epidural Patient location during procedure: OB Start time: 01/15/2022 12:12 PM End time: 01/15/2022 12:15 PM  Staffing Anesthesiologist: Kaylyn Layer, MD Performed: anesthesiologist   Preanesthetic Checklist Completed: patient identified, IV checked, risks and benefits discussed, monitors and equipment checked, pre-op evaluation and timeout performed  Epidural Patient position: sitting Prep: DuraPrep and site prepped and draped Patient monitoring: continuous pulse ox, blood pressure and heart rate Approach: midline Location: L3-L4 Injection technique: LOR air  Needle:  Needle type: Tuohy  Needle gauge: 17 G Needle length: 9 cm Needle insertion depth: 5 cm Catheter type: closed end flexible Catheter size: 19 Gauge Catheter at skin depth: 10 cm Test dose: negative and Other (1% lidocaine)  Assessment Events: blood not aspirated, injection not painful, no injection resistance, no paresthesia and negative IV test  Additional Notes Patient identified. Risks, benefits, and alternatives discussed with patient including but not limited to bleeding, infection, nerve damage, paralysis, failed block, incomplete pain control, headache, blood pressure changes, nausea, vomiting, reactions to medication, itching, and postpartum back pain. Confirmed with bedside nurse the patient's most recent platelet count. Confirmed with patient that they are not currently taking any anticoagulation, have any bleeding history, or any family history of bleeding disorders. Patient expressed understanding and wished to proceed. All questions were answered. Sterile technique was used throughout the entire procedure. Please see nursing notes for vital signs.   Crisp LOR on first pass. Test dose was given through epidural catheter and negative prior to continuing to dose epidural or start infusion. Warning signs of high block given to the patient including shortness of breath, tingling/numbness in hands, complete  motor block, or any concerning symptoms with instructions to call for help. Patient was given instructions on fall risk and not to get out of bed. All questions and concerns addressed with instructions to call with any issues or inadequate analgesia.  Reason for block:procedure for pain

## 2022-01-15 NOTE — Anesthesia Preprocedure Evaluation (Signed)
Anesthesia Evaluation  Patient identified by MRN, date of birth, ID band Patient awake    Reviewed: Allergy & Precautions, Patient's Chart, lab work & pertinent test results  History of Anesthesia Complications (+) PONV and history of anesthetic complications  Airway Mallampati: II  TM Distance: >3 FB Neck ROM: Full    Dental no notable dental hx.    Pulmonary neg pulmonary ROS,    Pulmonary exam normal        Cardiovascular negative cardio ROS Normal cardiovascular exam     Neuro/Psych Anxiety    GI/Hepatic negative GI ROS, Neg liver ROS,   Endo/Other  negative endocrine ROS  Renal/GU negative Renal ROS  negative genitourinary   Musculoskeletal negative musculoskeletal ROS (+)   Abdominal   Peds  Hematology negative hematology ROS (+)   Anesthesia Other Findings Day of surgery medications reviewed with patient.  Reproductive/Obstetrics (+) Pregnancy                             Anesthesia Physical Anesthesia Plan  ASA: 2  Anesthesia Plan: Epidural   Post-op Pain Management:    Induction:   PONV Risk Score and Plan: Treatment may vary due to age or medical condition  Airway Management Planned: Natural Airway  Additional Equipment: Fetal Monitoring  Intra-op Plan:   Post-operative Plan:   Informed Consent: I have reviewed the patients History and Physical, chart, labs and discussed the procedure including the risks, benefits and alternatives for the proposed anesthesia with the patient or authorized representative who has indicated his/her understanding and acceptance.       Plan Discussed with:   Anesthesia Plan Comments:         Anesthesia Quick Evaluation

## 2022-01-15 NOTE — Lactation Note (Signed)
This note was copied from a baby's chart. Lactation Consultation Note  Patient Name: Mckenzie Reeves Date: 01/15/2022 Reason for consult: L&D Initial assessment;Mother's request;Term;Breastfeeding assistance Age:28 hours LC assisted in latching with signs of milk transfer. Infant still feeding at the end of the visit. Infant get further support on MBU  Maternal Data Has patient been taught Hand Expression?: Yes  Feeding Mother's Current Feeding Choice: Breast Milk  LATCH Score Latch: Repeated attempts needed to sustain latch, nipple held in mouth throughout feeding, stimulation needed to elicit sucking reflex.  Audible Swallowing: Spontaneous and intermittent  Type of Nipple: Everted at rest and after stimulation  Comfort (Breast/Nipple): Soft / non-tender  Hold (Positioning): Assistance needed to correctly position infant at breast and maintain latch.  LATCH Score: 8   Lactation Tools Discussed/Used    Interventions Interventions: Breast feeding basics reviewed;Assisted with latch;Skin to skin;Breast massage;Breast compression;Adjust position;Support pillows;Position options;Expressed milk;Education  Discharge    Consult Status Consult Status: Follow-up from L&D Date: 01/16/22 Follow-up type: In-patient    Mckenzie Reeves 01/15/2022, 6:11 PM

## 2022-01-16 LAB — CBC
HCT: 30.5 % — ABNORMAL LOW (ref 36.0–46.0)
Hemoglobin: 10.4 g/dL — ABNORMAL LOW (ref 12.0–15.0)
MCH: 31.1 pg (ref 26.0–34.0)
MCHC: 34.1 g/dL (ref 30.0–36.0)
MCV: 91.3 fL (ref 80.0–100.0)
Platelets: 159 10*3/uL (ref 150–400)
RBC: 3.34 MIL/uL — ABNORMAL LOW (ref 3.87–5.11)
RDW: 13.2 % (ref 11.5–15.5)
WBC: 11.3 10*3/uL — ABNORMAL HIGH (ref 4.0–10.5)
nRBC: 0 % (ref 0.0–0.2)

## 2022-01-16 MED ORDER — IBUPROFEN 600 MG PO TABS: 600.0000 mg | ORAL_TABLET | Freq: Four times a day (QID) | ORAL | 0 refills | Status: AC | PRN

## 2022-01-16 NOTE — Lactation Note (Addendum)
This note was copied from a baby's chart. Lactation Consultation Note  Patient Name: Mckenzie Reeves VELFY'B Date: 01/16/2022 Reason for consult: Initial assessment;Term Age:28 hours  Mom is able to latch infant on her own, but has a tiny blister on each nipple. I provided birthing parent with Medela gel pads and placed them on her (after rinsing). Instructions for care discussed.   Time was spent on teaching hand expression once infant went to the nursery for circumcision.   Mom became tearful when recounting her previous breastfeeding experience with me (infant losing weight, lack of milk transfer, lip/tongue tie, etc).  With her 1st child, her milk came to volume around 72 hrs and she had an adequate supply (Mom recalls being able to pump enough for the next feeding and, sometimes, the feeding after that).   [name] would like me to return to assess tongue mobility. Mom has already noticed a difference in how this infant latches compared to their 1st child (now 30 yo).   [name] knows that if her migraines return, Fioricet is not considered compatible with breastfeeding (per e-lactancia).   Maternal Data Has patient been taught Hand Expression?: Yes Does the patient have breastfeeding experience prior to this delivery?: Yes How long did the patient breastfeed?: 15 months  Feeding Mother's Current Feeding Choice: Breast Milk  LATCH Score Latch: Grasps breast easily, tongue down, lips flanged, rhythmical sucking.  Audible Swallowing: A few with stimulation  Type of Nipple: Everted at rest and after stimulation  Comfort (Breast/Nipple): Soft / non-tender  Hold (Positioning): No assistance needed to correctly position infant at breast.  LATCH Score: 9   Lactation Tools Discussed/Used    Interventions Interventions: Education;Assisted with latch;LC Services brochure;Comfort gels  Discharge Pump: Personal (has a Web designer)  Consult Status Consult Status:  Follow-up Follow-up type: In-patient    Lurline Hare [facility] 01/16/2022, 1:39 PM

## 2022-01-16 NOTE — Discharge Summary (Signed)
Postpartum Discharge Summary       Patient Name: Mckenzie Reeves DOB: 06/22/1995 MRN: [id number]  Date of admission: 01/15/2022 Delivery date:01/15/2022  Delivering provider: Huel Cote  Date of discharge: 01/16/2022  Admitting diagnosis: Indication for care in labor and delivery, antepartum [O75.9] NSVD (normal spontaneous vaginal delivery) [O80] Intrauterine pregnancy: [redacted]w[redacted]d     Secondary diagnosis:  Principal Problem:   Indication for care in labor and delivery, antepartum Active Problems:   NSVD (normal spontaneous vaginal delivery)  Additional problems: Depression    Discharge diagnosis: Term Pregnancy Delivered                                              Post partum procedures: NA Augmentation: AROM and Pitocin Complications: None  Hospital course: Induction of Labor With Vaginal Delivery   26 y.o. yo G2P2002 at [redacted]w[redacted]d was admitted to the hospital 01/15/2022 for induction of labor.  Indication for induction: Elective.  Patient had an uncomplicated labor course as follows: Membrane Rupture Time/Date: 10:25 AM ,01/15/2022   Delivery Method:Vaginal, Spontaneous  Episiotomy: None  Lacerations:  1st degree;Periurethral  Details of delivery can be found in separate delivery note.  Patient had a routine postpartum course. Patient is discharged home 01/16/22.  Newborn Data: Birth date:01/15/2022  Birth time:5:05 PM  Gender:Female  Living status:Living  Apgars:8 ,9  Weight:3550 g   Magnesium Sulfate received: No BMZ received: No Rhophylac:N/A Physical exam  Vitals:   01/15/22 2045 01/16/22 0010 01/16/22 0420 01/16/22 0849  BP: 105/61 (!) 92/52 106/63 107/73  Pulse: 64 74 (!) 59 68  Resp: 16 16 16 16   Temp: 98.5 F (36.9 C) 98.3 F (36.8 C) 97.9 F (36.6 C) 98.7 F (37.1 C)  TempSrc: Oral Oral Oral   SpO2: 100% 100% 99% 99%  Weight:      Height:       General: alert, cooperative, and no distress Lochia: appropriate Uterine Fundus: firm Incision: Healing  well with no significant drainage DVT Evaluation: No evidence of DVT seen on physical exam. Labs: Lab Results  Component Value Date   WBC 11.3 (H) 01/16/2022   HGB 10.4 (L) 01/16/2022   HCT 30.5 (L) 01/16/2022   MCV 91.3 01/16/2022   PLT 159 01/16/2022      Latest Ref Rng & Units 07/01/2017    4:44 PM  CMP  Glucose 65 - 99 mg/dL 83   BUN 6 - 20 mg/dL 11   Creatinine 08/29/2017 - 1.00 mg/dL 0.27   Sodium 2.53 - 664 mmol/L 140   Potassium 3.5 - 5.2 mmol/L 4.2   Chloride 96 - 106 mmol/L 100   CO2 20 - 29 mmol/L 23   Calcium 8.7 - 10.2 mg/dL 9.5   Total Protein 6.0 - 8.5 g/dL 7.4   Total Bilirubin 0.0 - 1.2 mg/dL 0.3   Alkaline Phos 39 - 117 IU/L 95   AST 0 - 40 IU/L 18   ALT 0 - 32 IU/L 16    Edinburgh Score:    01/16/2022   12:32 PM  Edinburgh Postnatal Depression Scale Screening Tool  I have been able to laugh and see the funny side of things. 0  I have looked forward with enjoyment to things. 0  I have blamed myself unnecessarily when things went wrong. 1  I have been anxious or worried for no good reason.  1  I have felt scared or panicky for no good reason. 0  Things have been getting on top of me. 1  I have been so unhappy that I have had difficulty sleeping. 0  I have felt sad or miserable. 0  I have been so unhappy that I have been crying. 0  The thought of harming myself has occurred to me. 0  Edinburgh Postnatal Depression Scale Total 3      After visit meds:  Allergies as of 01/16/2022       Reactions   Ciprofloxacin         Medication List     STOP taking these medications    spironolactone 100 MG tablet Commonly known as: ALDACTONE   triamcinolone ointment 0.5 % Commonly known as: KENALOG   Vitamin D 125 MCG (5000 UT) Caps       TAKE these medications    ibuprofen 600 MG tablet Commonly known as: ADVIL Take 1 tablet (600 mg total) by mouth every 6 (six) hours as needed.   PRENATAL VITAMIN PO Take by mouth.   sertraline 100 MG  tablet Commonly known as: Zoloft Take 1 tablet (100 mg total) by mouth daily.               Discharge Care Instructions  (From admission, onward)           Start     Ordered   01/16/22 0000  Discharge wound care:       Comments: For a cesarean delivery: You may wash incision with soap and water.  Do not soak or submerge the incision for 2 weeks. Keep incision dry. You may need to keep a sanitary pad or panty liner between the incision and your clothing for comfort and to keep the incision dry. If you note drainage, increased pain, or increased redness of the incision, then please notify your physician.   01/16/22 1714   01/16/22 0000  If the dressing is still on your incision site when you go home, remove it on the third day after your surgery date. Remove dressing if it begins to fall off, or if it is dirty or damaged before the third day.       Comments: For a cesarean delivery   01/16/22 1714             Discharge home in stable condition Infant Feeding: Breast Infant Disposition:home with mother Discharge instruction: per After Visit Summary and Postpartum booklet. Activity: Advance as tolerated. Pelvic rest for 6 weeks.  Diet: routine diet Anticipated Birth Control: Unsure Postpartum Appointment:4 weeks Future Appointments:No future appointments. Follow up Visit:  Follow-up Information     [facility] Follow up in 4 week(s).   Why: For a postpartum evaluation Contact information: [address]  [address] [place] [address] [phone]                     01/16/2022 Waynard Reeds, MD

## 2022-01-16 NOTE — Lactation Note (Signed)
This note was copied from a baby's chart. Lactation Consultation Note  Patient Name: Mckenzie Reeves UUVOZ'D Date: 01/16/2022 Reason for consult: Mother's request Age:28 hours  Infant with good tongue mobility: good extension, cupping with good attempts at lateralization. Infant latched with ease; he only needed his mandible lowered.   [name] knows how to reach [place] for post-discharge questions. [name] may choose to pump q2 hrs or so and feed back any EBM to infant until her milk comes to volume.   Maternal Data Has patient been taught Hand Expression?: Yes Does the patient have breastfeeding experience prior to this delivery?: Yes How long did the patient breastfeed?: 15 months  Feeding Mother's Current Feeding Choice: Breast Milk  LATCH Score Latch: Grasps breast easily, tongue down, lips flanged, rhythmical sucking.  Audible Swallowing: None  Type of Nipple: Everted at rest and after stimulation  Comfort (Breast/Nipple): Soft / non-tender  Hold (Positioning): No assistance needed to correctly position infant at breast.  LATCH Score: 8   Interventions Interventions: Education;Assisted with latch  Discharge Pump: Personal (has a Spectra)  Consult Status Consult Status: Complete Follow-up type: In-patient    Lurline Hare [facility] 01/16/2022, 2:15 PM

## 2022-01-16 NOTE — Social Work (Signed)
CSW received consult for hx of Depression.  CSW met with [name] to offer support and complete assessment.    CSW met with [name] at bedside and introduced CSW role. CSW observed [name] breastfeeding and bonding with the infant. [name] presented pleasant and engaged with CSW during the visit. FOB was present at bedside. CSW offered privacy. [name] gave CSW permission to share information with FOB present. CSW inquired how [name] has felt since giving birth. [name] expressed, "I am feeling good much better than my first." She reported that she felt good during the pregnancy with a stable mood. [name] acknowledged that she has a history of depression that onset after she gave birth to their daughter in 2020. [name] expressed that she had PPD symptoms and felt "very overwhelmed." She feared to take the infant out places and doubted that she was being a good mother. [name] shared that because of the pandemic in 2020 her baby did not receive the same care and later learned her baby was having "eating issues." [name] reported she reached out to her provider for mental health support and was prescribed Zoloft to treat her symptoms "it's a world of difference." [name] reported she will continue to take Zoloft to be proactive. [name] reported she also has access to a virtual therapist if needed. [name] identified her spouse, immediate family and friends as supports. CSW assessed [name] for safety. [name] denied thoughts of harm to self and others.   CSW provided education regarding the baby blues period vs. perinatal mood disorders, discussed treatment and gave resources for mental health follow up if concerns arise.  CSW recommended [name] complete a self-evaluation during the postpartum time period using the New Mom Checklist from Postpartum Progress and encouraged [name] to contact a medical professional if symptoms are noted at any time.    [name] reported she has all essential items to care for the infant. CSW provided review of Sudden Infant Death Syndrome (SIDS)  precautions. [name] reported understanding. [name] has chosen [facility] for the infant's follow up care. CSW assessed [name] for additional needs. [name] reported no further need.   CSW identifies no further need for intervention and no barriers to discharge at this time.    Jaxsyn Azam, MSW, LCSW [facility]  Clinical Social Worker  [phone] 01/16/2022  11:37 AM  

## 2022-01-16 NOTE — Progress Notes (Signed)
Post Partum Day 1 Subjective: no complaints, up ad lib, voiding, and tolerating PO  Objective: Blood pressure 107/73, pulse 68, temperature 98.7 F (37.1 C), resp. rate 16, height 5\' 9"  (1.753 m), weight 102.5 kg, SpO2 99 %, unknown if currently breastfeeding.  Physical Exam:  General: alert, cooperative, and appears stated age Lochia: appropriate Uterine Fundus: firm DVT Evaluation: No evidence of DVT seen on physical exam.  Recent Labs    01/15/22 0802 01/16/22 0446  HGB 12.1 10.4*  HCT 36.2 30.5*    Assessment/Plan: Breastfeeding Desires neonatal circumcision, R/B/A of procedure discussed at length. Pt understands that neonatal circumcision is not considered medically necessary and is elective. The risks include, but are not limited to bleeding, infection, damage to the penis, development of scar tissue, and having to have it redone at a later date. Pt understands theses risks and wishes to proceed If baby Ok for D/C today will discharge home   LOS: 1 day   01/18/22, MD 01/16/2022, 12:38 PM

## 2022-01-16 NOTE — Anesthesia Postprocedure Evaluation (Signed)
Anesthesia Post Note  Patient: LAJOYA DOMBEK  Procedure(s) Performed: AN AD HOC LABOR EPIDURAL     Patient location during evaluation: Mother Baby Anesthesia Type: Epidural Level of consciousness: awake and alert Pain management: pain level controlled Vital Signs Assessment: post-procedure vital signs reviewed and stable Respiratory status: spontaneous breathing, nonlabored ventilation and respiratory function stable Cardiovascular status: stable Postop Assessment: no headache, no backache and epidural receding Anesthetic complications: no   No notable events documented.  Last Vitals:  Vitals:   01/16/22 0849 01/16/22 1744  BP: 107/73 108/71  Pulse: 68 83  Resp: 16 16  Temp: 37.1 C 36.9 C  SpO2: 99% 100%    Last Pain:  Vitals:   01/16/22 1850  TempSrc:   PainSc: 4    Pain Goal: Patients Stated Pain Goal: 6 (01/15/22 1210)                 Jennye Moccasin, Weldon Picking

## 2022-01-23 ENCOUNTER — Telehealth (HOSPITAL_COMMUNITY): Payer: Self-pay | Admitting: *Deleted

## 2022-01-23 NOTE — Telephone Encounter (Signed)
Left phone voicemail message.  Duffy Rhody, RN 01-23-2022 at 11:57am

## 2022-01-25 ENCOUNTER — Inpatient Hospital Stay (HOSPITAL_COMMUNITY): Payer: BC Managed Care – PPO

## 2022-01-25 ENCOUNTER — Inpatient Hospital Stay (HOSPITAL_COMMUNITY)
Admission: AD | Admit: 2022-01-25 | Payer: BC Managed Care – PPO | Source: Home / Self Care | Admitting: Obstetrics and Gynecology
